# Patient Record
Sex: Female | Born: 1945 | Race: Black or African American | Hispanic: No | Smoking: Former smoker
Health system: Southern US, Community
[De-identification: ages and names within clinical notes are randomized; demographics above are authoritative.]

## PROBLEM LIST (undated history)

## (undated) DIAGNOSIS — M1990 Unspecified osteoarthritis, unspecified site: Secondary | ICD-10-CM

## (undated) DIAGNOSIS — I10 Essential (primary) hypertension: Secondary | ICD-10-CM

## (undated) HISTORY — PX: JOINT REPLACEMENT: SHX530

## (undated) HISTORY — PX: TOTAL KNEE ARTHROPLASTY: SHX125

## (undated) HISTORY — PX: TUBAL LIGATION: SHX77

---

## 2011-08-03 ENCOUNTER — Emergency Department (HOSPITAL_BASED_OUTPATIENT_CLINIC_OR_DEPARTMENT_OTHER)
Admission: EM | Admit: 2011-08-03 | Discharge: 2011-08-03 | Disposition: A | Discharge status: Home / Self Care | Payer: BC Managed Care – PPO | Attending: Emergency Medicine | Admitting: Emergency Medicine

## 2011-08-03 ENCOUNTER — Emergency Department (INDEPENDENT_AMBULATORY_CARE_PROVIDER_SITE_OTHER): Payer: BC Managed Care – PPO

## 2011-08-03 ENCOUNTER — Encounter (HOSPITAL_BASED_OUTPATIENT_CLINIC_OR_DEPARTMENT_OTHER): Payer: Self-pay | Admitting: *Deleted

## 2011-08-03 DIAGNOSIS — M949 Disorder of cartilage, unspecified: Secondary | ICD-10-CM

## 2011-08-03 DIAGNOSIS — M899 Disorder of bone, unspecified: Secondary | ICD-10-CM

## 2011-08-03 DIAGNOSIS — W010XXA Fall on same level from slipping, tripping and stumbling without subsequent striking against object, initial encounter: Secondary | ICD-10-CM | POA: Insufficient documentation

## 2011-08-03 DIAGNOSIS — Z8739 Personal history of other diseases of the musculoskeletal system and connective tissue: Secondary | ICD-10-CM | POA: Insufficient documentation

## 2011-08-03 DIAGNOSIS — M25569 Pain in unspecified knee: Secondary | ICD-10-CM

## 2011-08-03 DIAGNOSIS — M1710 Unilateral primary osteoarthritis, unspecified knee: Secondary | ICD-10-CM

## 2011-08-03 DIAGNOSIS — S8000XA Contusion of unspecified knee, initial encounter: Secondary | ICD-10-CM | POA: Insufficient documentation

## 2011-08-03 DIAGNOSIS — E119 Type 2 diabetes mellitus without complications: Secondary | ICD-10-CM | POA: Insufficient documentation

## 2011-08-03 DIAGNOSIS — Z79899 Other long term (current) drug therapy: Secondary | ICD-10-CM | POA: Insufficient documentation

## 2011-08-03 DIAGNOSIS — Y93E1 Activity, personal bathing and showering: Secondary | ICD-10-CM | POA: Insufficient documentation

## 2011-08-03 DIAGNOSIS — W19XXXA Unspecified fall, initial encounter: Secondary | ICD-10-CM

## 2011-08-03 DIAGNOSIS — I10 Essential (primary) hypertension: Secondary | ICD-10-CM | POA: Insufficient documentation

## 2011-08-03 DIAGNOSIS — Y92009 Unspecified place in unspecified non-institutional (private) residence as the place of occurrence of the external cause: Secondary | ICD-10-CM | POA: Insufficient documentation

## 2011-08-03 DIAGNOSIS — J45909 Unspecified asthma, uncomplicated: Secondary | ICD-10-CM | POA: Insufficient documentation

## 2011-08-03 DIAGNOSIS — S8012XA Contusion of left lower leg, initial encounter: Secondary | ICD-10-CM

## 2011-08-03 HISTORY — DX: Unspecified osteoarthritis, unspecified site: M19.90

## 2011-08-03 HISTORY — DX: Essential (primary) hypertension: I10

## 2011-08-03 MED ORDER — HYDROCODONE-ACETAMINOPHEN 5-325 MG PO TABS
1.0000 | ORAL_TABLET | Freq: Once | ORAL | Status: AC
Start: 2011-08-03 — End: 2011-08-03
  Administered 2011-08-03: 1 via ORAL
  Filled 2011-08-03: qty 1

## 2011-08-03 MED ORDER — HYDROCODONE-ACETAMINOPHEN 5-325 MG PO TABS: 1.0000 | ORAL_TABLET | ORAL | Status: AC | PRN

## 2011-08-03 NOTE — Discharge Instructions (Signed)
Contusion A contusion is a deep bruise. Bruises happen when an injury causes bleeding under the skin. Signs of bruising include pain, puffiness (swelling), and discolored skin. The bruise may turn blue, purple, or yellow. HOME CARE   Rest the injured area until the pain and puffiness are better.   Try to limit use of the injured area as much as possible or as told by your doctor.   Put ice on the injured area.   Put ice in a plastic bag.   Place a towel between your skin and the bag.   Leave the ice on for 15 to 20 minutes, 3 to 4 times a day.   Raise (elevate) the injured area above the level of the heart.   Use an elastic bandage to lessen puffiness and motion.   Only take medicine as told by your doctor.   Eat healthy.   See your doctor for a follow-up visit.  GET HELP RIGHT AWAY IF:   There is more redness, puffiness, or pain.   You have a headache, muscle ache, or you feel dizzy and ill.   You have a fever.   The pain is not controlled with medicine.   The bruise is not getting better.   There is yellowish white fluid (pus) coming from the wound.   You lose feeling (numbness) in the injured area.   The bruised area feels cold.   There are new problems.  MAKE SURE YOU:   Understand these instructions.   Will watch your condition.   Will get help right away if you are not doing well or get worse.  Document Released: 11/20/2007 Document Revised: 02/13/2011 Document Reviewed: 11/20/2007 [facility] Patient Information 2012 [place], [place].    Do not take Xanax while taking the pain medicine prescribed as it can be a dangerous combination. Take Tylenol for mild pain or the pain medicine prescribed for bad pain. Keep your scheduled appointment with your doctor. in 2 days

## 2011-08-03 NOTE — ED Notes (Signed)
Pt states she slipped in the shower today and injured her left knee. Hx total knee replacement on the right about 1-1/2 years ago. Also hit left side of face.

## 2011-08-03 NOTE — ED Provider Notes (Signed)
History    Scribed for Doug Sou, MD, the patient was seen in room [facility]. This chart was scribed by Katha Cabal.   CSN: [id number]  Arrival date & time 08/03/11  1329   First MD Initiated Contact with Patient 08/03/11 1605      Chief Complaint  Patient presents with  . Fall    (Consider location/radiation/quality/duration/timing/severity/associated sxs/prior treatment) HPI Patient seen at 4:07 PM  Tina [name] is a 66 y.o. female who presents to the Emergency Department complaining of fall around 1 PM today. Patient slipped in the shower today. Patient struck left face during fall. Pain and swelling around left knee.  Reports left knee soreness and stiffness.   Patient was helped up by husband after fall.   Patient has not taken anything for the pain.   Pain rated 4/10.   Discomfort of face is minimal as patient put hand up to shield her face.  There was no blood loss.  Patient with history if diabetes mellitus and hypertension.  Patient with allergy to Percocet.     Past Medical History  Diagnosis Date  . Diabetes mellitus   . Hypertension   . Asthma   . Arthritis     Past Surgical History  Procedure Date  . Total knee arthroplasty   . Tubal ligation     History reviewed. No pertinent family history.  History  Substance Use Topics  . Smoking status: Never Smoker   . Smokeless tobacco: Not on file  . Alcohol Use:     OB History    Grav Para Term Preterm Abortions TAB SAB Ect Mult Living                  Review of Systems  Constitutional: Negative.   HENT: Negative.   Respiratory: Negative.   Cardiovascular: Negative.   Gastrointestinal: Negative.   Musculoskeletal:       Left knee pain  Skin: Negative.   Neurological: Negative.   Hematological: Negative.   Psychiatric/Behavioral: Negative.     Allergies  Lactose intolerance (gi) and Percocet  Home Medications   Current Outpatient Rx  Name Route Sig Dispense Refill  . ALPRAZOLAM  0.25 MG PO TABS Oral Take 0.25 mg by mouth 3 (three) times daily as needed. For anxiety    . ASPIRIN 81 MG PO TABS Oral Take 81 mg by mouth daily.    Marland Kitchen CALCIUM-VITAMIN D-VITAMIN K 500-100-40 MG-UNT-MCG PO CHEW Oral Chew 2 tablets by mouth daily.    . OMEGA-3 FATTY ACIDS 1000 MG PO CAPS Oral Take 1 g by mouth 3 (three) times daily as needed. Every other day     . FLUTICASONE-SALMETEROL 250-50 MCG/DOSE IN AEPB Inhalation Inhale 1 puff into the lungs every 12 (twelve) hours.    Marland Kitchen LEVALBUTEROL TARTRATE 45 MCG/ACT IN AERO Inhalation Inhale 1-2 puffs into the lungs every 4 (four) hours as needed. For shortness of breath and wheezing    . OMEPRAZOLE 40 MG PO CPDR Oral Take 40 mg by mouth daily.    Marland Kitchen PIOGLITAZONE HCL 30 MG PO TABS Oral Take 30 mg by mouth daily.    Marland Kitchen SIMVASTATIN 80 MG PO TABS Oral Take 40 mg by mouth at bedtime.     . TELMISARTAN-HCTZ 80-25 MG PO TABS Oral Take 1 tablet by mouth daily.    Marland Kitchen ZOLPIDEM TARTRATE 10 MG PO TABS Oral Take 10 mg by mouth at bedtime as needed. For sleep      BP 128/65  Pulse  70  Temp(Src) 97.9 F (36.6 C) (Oral)  Resp 18  Ht 5\' 6"  (1.676 m)  Wt 218 lb (98.884 kg)  BMI 35.19 kg/m2  SpO2 100%  Physical Exam  Nursing note and vitals reviewed. Constitutional: She appears well-developed and well-nourished.  HENT:  Head: Normocephalic and atraumatic.  Eyes: Conjunctivae are normal. Pupils are equal, round, and reactive to light.  Neck: Neck supple. No tracheal deviation present. No thyromegaly present.  Cardiovascular: Normal rate and regular rhythm.   No murmur heard. Pulmonary/Chest: Effort normal and breath sounds normal.  Abdominal: Soft. Bowel sounds are normal. She exhibits no distension. There is no tenderness.  Musculoskeletal: Normal range of motion. She exhibits tenderness. She exhibits no edema.       Left lower extremity Swelling and tenderness to promixal shin, immediately distal to the knee .knee non tender, non ligament laxity, DP pulse  2+,    Neurological: She is alert. Coordination normal.  Skin: Skin is warm and dry. No rash noted.  Psychiatric: She has a normal mood and affect.    ED Course  Procedures (including critical care time)   DIAGNOSTIC STUDIES: Oxygen Saturation is 100% on room air, normal by my interpretation.     COORDINATION OF CARE: 4:15 PM  Physical exam complete.  Will get patient up to walk around.   4:20 PM  Walks with lymph favoring left leg with cane.       LABS / RADIOLOGY:   Labs Reviewed - No data to display Dg Knee Complete 4 Views Left  08/03/2011  *RADIOLOGY REPORT*  Clinical Data: Fall in shower with anterior distal knee pain.  LEFT KNEE - COMPLETE 4+ VIEW  Comparison: None.  Findings: Mild osteopenia.  Moderate to marked medial and moderate lateral compartment joint space narrowing and osteophyte formation. 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  Vascular calcifications which are age advanced.  Artifact on the first two images.  No convincing evidence of underlying fracture. No definite joint effusion.  Enthesopathic change at the quadriceps insertion.  IMPRESSION:  1.  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 2.  No definite acute finding identified. 3.  Degraded exam, secondary the extent of underlying arthropathy and clothing artifact on the first two images.  If high clinical concern of fracture, CT should be considered.  Original Report Authenticated By: Consuello Bossier, M.D.     No diagnosis found.    MDM  Plan prescription hydrocodone-A. Pap. Patient instructed to not take Xanax while taking Norco. Keep scheduled appointment with PMD in 2 days. Clinically I do not feel that knee is involved in injury today. Proximal tibia/fibula as well visualized on x-ray an x-ray was reviewed by me Diagnosis contusion left lower extremity   I personally performed the services described in this documentation, which was scribed in my presence. The  recorded information has been reviewed and considered.    Doug Sou, MD 08/03/11 [phone]

## 2012-11-21 DIAGNOSIS — R0602 Shortness of breath: Secondary | ICD-10-CM | POA: Insufficient documentation

## 2012-11-21 DIAGNOSIS — K219 Gastro-esophageal reflux disease without esophagitis: Secondary | ICD-10-CM | POA: Insufficient documentation

## 2012-11-21 DIAGNOSIS — K449 Diaphragmatic hernia without obstruction or gangrene: Secondary | ICD-10-CM | POA: Insufficient documentation

## 2012-11-21 DIAGNOSIS — E669 Obesity, unspecified: Secondary | ICD-10-CM | POA: Insufficient documentation

## 2013-03-31 DIAGNOSIS — J309 Allergic rhinitis, unspecified: Secondary | ICD-10-CM | POA: Insufficient documentation

## 2013-03-31 DIAGNOSIS — J3089 Other allergic rhinitis: Secondary | ICD-10-CM | POA: Insufficient documentation

## 2013-08-17 DIAGNOSIS — E1139 Type 2 diabetes mellitus with other diabetic ophthalmic complication: Secondary | ICD-10-CM | POA: Insufficient documentation

## 2013-08-17 DIAGNOSIS — D509 Iron deficiency anemia, unspecified: Secondary | ICD-10-CM | POA: Insufficient documentation

## 2013-08-17 DIAGNOSIS — G4733 Obstructive sleep apnea (adult) (pediatric): Secondary | ICD-10-CM | POA: Insufficient documentation

## 2013-08-17 DIAGNOSIS — J45909 Unspecified asthma, uncomplicated: Secondary | ICD-10-CM | POA: Insufficient documentation

## 2013-08-17 DIAGNOSIS — E1149 Type 2 diabetes mellitus with other diabetic neurological complication: Secondary | ICD-10-CM | POA: Insufficient documentation

## 2013-08-17 DIAGNOSIS — R600 Localized edema: Secondary | ICD-10-CM | POA: Insufficient documentation

## 2013-08-17 DIAGNOSIS — R609 Edema, unspecified: Secondary | ICD-10-CM | POA: Insufficient documentation

## 2013-08-17 DIAGNOSIS — E559 Vitamin D deficiency, unspecified: Secondary | ICD-10-CM | POA: Insufficient documentation

## 2013-08-17 DIAGNOSIS — E7800 Pure hypercholesterolemia, unspecified: Secondary | ICD-10-CM | POA: Insufficient documentation

## 2013-08-17 DIAGNOSIS — D649 Anemia, unspecified: Secondary | ICD-10-CM | POA: Insufficient documentation

## 2013-09-09 DIAGNOSIS — I10 Essential (primary) hypertension: Secondary | ICD-10-CM | POA: Insufficient documentation

## 2013-09-09 DIAGNOSIS — F459 Somatoform disorder, unspecified: Secondary | ICD-10-CM | POA: Insufficient documentation

## 2013-09-09 DIAGNOSIS — G4700 Insomnia, unspecified: Secondary | ICD-10-CM | POA: Insufficient documentation

## 2013-10-15 DIAGNOSIS — M7730 Calcaneal spur, unspecified foot: Secondary | ICD-10-CM | POA: Insufficient documentation

## 2013-10-15 DIAGNOSIS — M79609 Pain in unspecified limb: Secondary | ICD-10-CM | POA: Insufficient documentation

## 2014-03-28 DIAGNOSIS — J431 Panlobular emphysema: Secondary | ICD-10-CM | POA: Insufficient documentation

## 2014-03-28 DIAGNOSIS — E876 Hypokalemia: Secondary | ICD-10-CM | POA: Insufficient documentation

## 2014-10-03 DIAGNOSIS — R0989 Other specified symptoms and signs involving the circulatory and respiratory systems: Secondary | ICD-10-CM | POA: Insufficient documentation

## 2014-10-10 DIAGNOSIS — I517 Cardiomegaly: Secondary | ICD-10-CM | POA: Insufficient documentation

## 2014-10-10 DIAGNOSIS — I272 Pulmonary hypertension, unspecified: Secondary | ICD-10-CM | POA: Insufficient documentation

## 2014-10-10 DIAGNOSIS — I361 Nonrheumatic tricuspid (valve) insufficiency: Secondary | ICD-10-CM | POA: Insufficient documentation

## 2014-10-19 DIAGNOSIS — H269 Unspecified cataract: Secondary | ICD-10-CM | POA: Insufficient documentation

## 2014-10-19 DIAGNOSIS — H409 Unspecified glaucoma: Secondary | ICD-10-CM | POA: Insufficient documentation

## 2014-11-25 DIAGNOSIS — R0989 Other specified symptoms and signs involving the circulatory and respiratory systems: Secondary | ICD-10-CM | POA: Insufficient documentation

## 2014-12-12 DIAGNOSIS — I6523 Occlusion and stenosis of bilateral carotid arteries: Secondary | ICD-10-CM | POA: Insufficient documentation

## 2014-12-20 DIAGNOSIS — M25562 Pain in left knee: Secondary | ICD-10-CM | POA: Insufficient documentation

## 2014-12-27 DIAGNOSIS — R42 Dizziness and giddiness: Secondary | ICD-10-CM | POA: Insufficient documentation

## 2015-04-20 DIAGNOSIS — M2041 Other hammer toe(s) (acquired), right foot: Secondary | ICD-10-CM | POA: Insufficient documentation

## 2015-07-17 DIAGNOSIS — I739 Peripheral vascular disease, unspecified: Secondary | ICD-10-CM | POA: Insufficient documentation

## 2015-10-18 DIAGNOSIS — Z96653 Presence of artificial knee joint, bilateral: Secondary | ICD-10-CM | POA: Insufficient documentation

## 2015-10-18 DIAGNOSIS — M7052 Other bursitis of knee, left knee: Secondary | ICD-10-CM | POA: Insufficient documentation

## 2016-04-15 DIAGNOSIS — B351 Tinea unguium: Secondary | ICD-10-CM | POA: Insufficient documentation

## 2016-07-29 DIAGNOSIS — M8580 Other specified disorders of bone density and structure, unspecified site: Secondary | ICD-10-CM | POA: Insufficient documentation

## 2016-08-28 DIAGNOSIS — F458 Other somatoform disorders: Secondary | ICD-10-CM | POA: Insufficient documentation

## 2016-08-28 DIAGNOSIS — F411 Generalized anxiety disorder: Secondary | ICD-10-CM | POA: Insufficient documentation

## 2016-09-21 ENCOUNTER — Encounter (HOSPITAL_BASED_OUTPATIENT_CLINIC_OR_DEPARTMENT_OTHER): Payer: Self-pay | Admitting: *Deleted

## 2016-09-21 ENCOUNTER — Emergency Department (HOSPITAL_BASED_OUTPATIENT_CLINIC_OR_DEPARTMENT_OTHER)
Admission: EM | Admit: 2016-09-21 | Discharge: 2016-09-21 | Disposition: A | Discharge status: Home / Self Care | Payer: Medicare Other | Attending: Emergency Medicine | Admitting: Emergency Medicine

## 2016-09-21 DIAGNOSIS — Z79899 Other long term (current) drug therapy: Secondary | ICD-10-CM | POA: Insufficient documentation

## 2016-09-21 DIAGNOSIS — Z7982 Long term (current) use of aspirin: Secondary | ICD-10-CM | POA: Diagnosis not present

## 2016-09-21 DIAGNOSIS — J45909 Unspecified asthma, uncomplicated: Secondary | ICD-10-CM | POA: Diagnosis not present

## 2016-09-21 DIAGNOSIS — Z87891 Personal history of nicotine dependence: Secondary | ICD-10-CM | POA: Diagnosis not present

## 2016-09-21 DIAGNOSIS — M62838 Other muscle spasm: Secondary | ICD-10-CM

## 2016-09-21 DIAGNOSIS — M25511 Pain in right shoulder: Secondary | ICD-10-CM | POA: Diagnosis present

## 2016-09-21 DIAGNOSIS — E119 Type 2 diabetes mellitus without complications: Secondary | ICD-10-CM | POA: Diagnosis not present

## 2016-09-21 DIAGNOSIS — I10 Essential (primary) hypertension: Secondary | ICD-10-CM | POA: Insufficient documentation

## 2016-09-21 DIAGNOSIS — M542 Cervicalgia: Secondary | ICD-10-CM

## 2016-09-21 MED ORDER — CYCLOBENZAPRINE HCL 5 MG PO TABS: 5.0000 mg | ORAL_TABLET | Freq: Two times a day (BID) | ORAL | 0 refills | Status: DC | PRN

## 2016-09-21 NOTE — ED Triage Notes (Signed)
Patient states for the last week she has had some intermittent pain in the right shoulder and neck.  States last night she developed a sharp pain in the right upper shoulder with radiation into the right neck.  Pain is now constant.

## 2016-09-21 NOTE — ED Provider Notes (Signed)
[facility] Provider Note   CSN: [id number] Arrival date & time: 09/21/16  4098     History   Chief Complaint Chief Complaint  Patient presents with  . Shoulder Pain    right    HPI Tina [name] is a 71 y.o. female with history of diabetes, hypertension who presents with a one-week history of right upper shoulder and right-sided neck pain. Patient states she woke up with the pain around one week ago. She has had intermittent worsening sharp pain with certain movements. Patient has pain with moving her neck. She has taken Tylenol without significant relief. She denies any numbness or tingling in her arms or difficulty moving her arms. 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 She denies any known trauma or specific injury.  HPI  Past Medical History:  Diagnosis Date  . Arthritis   . Asthma   . Diabetes mellitus   . Hypertension     There are no active problems to display for this patient.   Past Surgical History:  Procedure Laterality Date  . JOINT REPLACEMENT Bilateral    Knee replacements  . TOTAL KNEE ARTHROPLASTY    . TUBAL LIGATION      OB History    No data available       Home Medications    Prior to Admission medications   Medication Sig Start Date End Date Taking? Authorizing Provider  aspirin 81 MG tablet Take 325 mg by mouth daily.    Yes Historical Provider, MD  clopidogrel (PLAVIX) 75 MG tablet Take 75 mg by mouth daily.   Yes Historical Provider, MD  Fluticasone-Salmeterol (ADVAIR) 250-50 MCG/DOSE AEPB Inhale 1 puff into the lungs every 12 (twelve) hours.   Yes Historical Provider, MD  gabapentin (NEURONTIN) 300 MG capsule Take 300 mg by mouth 3 (three) times daily.   Yes Historical Provider, MD  pioglitazone (ACTOS) 30 MG tablet Take 30 mg by mouth daily.   Yes Historical Provider, MD  potassium  chloride (K-DUR,KLOR-CON) 10 MEQ tablet Take 10 mEq by mouth daily.   Yes Historical Provider, MD  ranitidine (ZANTAC) 150 MG capsule Take 150 mg by mouth 2 (two) times daily.   Yes Historical Provider, MD  sertraline (ZOLOFT) 25 MG tablet Take 25 mg by mouth daily.   Yes Historical Provider, MD  simvastatin (ZOCOR) 80 MG tablet Take 40 mg by mouth at bedtime.    Yes Historical Provider, MD  zolpidem (AMBIEN) 10 MG tablet Take 10 mg by mouth at bedtime as needed. For sleep   Yes Historical Provider, MD  ALPRAZolam (XANAX) 0.25 MG tablet Take 0.25 mg by mouth 3 (three) times daily as needed. For anxiety    Historical Provider, MD  Calcium-Vitamin D-Vitamin K (VIACTIV) 500-100-40 MG-UNT-MCG CHEW Chew 2 tablets by mouth daily.    Historical Provider, MD  cyclobenzaprine (FLEXERIL) 5 MG tablet Take 1 tablet (5 mg total) by mouth 2 (two) times daily as needed for muscle spasms. 09/21/16   Emi Holes, PA-C  fish oil-omega-3 fatty acids 1000 MG capsule Take 1 g by mouth 3 (three) times daily as needed. Every other day     Historical Provider, MD  levalbuterol [facility] HFA) 45 MCG/ACT inhaler Inhale 1-2 puffs into the lungs every 4 (four) hours as needed. For shortness of breath and wheezing    Historical Provider, MD  omeprazole (PRILOSEC) 40 MG  capsule Take 40 mg by mouth daily.    Historical Provider, MD  telmisartan-hydrochlorothiazide (MICARDIS HCT) 80-25 MG per tablet Take 1 tablet by mouth daily.    Historical Provider, MD    Family History No family history on file.  Social History Social History  Substance Use Topics  . Smoking status: Former Games developer  . Smokeless tobacco: Never Used  . Alcohol use No     Comment: rarely     Allergies   Aleve [naproxen]; Lactose intolerance (gi); Other; Percocet [oxycodone-acetaminophen]; and Vicodin [hydrocodone-acetaminophen]   Review of Systems Review of Systems  Constitutional: Negative for chills and fever.  HENT: Negative for facial  swelling and sore throat.   Respiratory: Negative for shortness of breath.   Cardiovascular: Negative for chest pain.  Gastrointestinal: Negative for abdominal pain, nausea and vomiting.  Genitourinary: Negative for dysuria.  Musculoskeletal: Positive for arthralgias and neck pain (R upper trapezius). Negative for back pain.  Skin: Negative for rash and wound.  Neurological: Negative for numbness and headaches.  Psychiatric/Behavioral: The patient is not nervous/anxious.      Physical Exam Updated Vital Signs BP (!) 147/59 (BP Location: Left Arm)   Pulse 64   Temp 98.4 F (36.9 C) (Oral)   Resp 20   Ht  (1.676 m)   Wt 101.2 kg   SpO2 98%   BMI 35.99 kg/m   Physical Exam  Constitutional: She appears well-developed and well-nourished. No distress.  HENT:  Head: Normocephalic and atraumatic.  Mouth/Throat: Oropharynx is clear and moist. No oropharyngeal exudate.  Eyes: Conjunctivae are normal. Pupils are equal, round, and reactive to light. Right eye exhibits no discharge. Left eye exhibits no discharge. No scleral icterus.  Neck: Neck supple. Muscular tenderness present. No spinous process tenderness present. Decreased range of motion present. No thyromegaly present.    Cardiovascular: Normal rate, regular rhythm, normal heart sounds and intact distal pulses.  Exam reveals no gallop and no friction rub.   No murmur heard. Pulmonary/Chest: Effort normal and breath sounds normal. No stridor. No respiratory distress. She has no wheezes. She has no rales.  Abdominal: Soft. Bowel sounds are normal. She exhibits no distension. There is no tenderness. There is no rebound and no guarding.  Musculoskeletal: She exhibits no edema.       Right shoulder: She exhibits tenderness, bony tenderness and spasm.  Lymphadenopathy:    She has no cervical adenopathy.  Neurological: She is alert. Coordination normal.  Normal sensation and 5/5 strength to upper extremities, bilateral equal grip  strength  Skin: Skin is warm and dry. No rash noted. She is not diaphoretic. No pallor.  Psychiatric: She has a normal mood and affect.  Nursing note and vitals reviewed.    ED Treatments / Results  Labs (all labs ordered are listed, but only abnormal results are displayed) Labs Reviewed - No data to display  EKG  EKG Interpretation None       Radiology No results found.  Procedures Procedures (including critical care time)  Medications Ordered in ED Medications - No data to display   Initial Impression / Assessment and Plan / ED Course  I have reviewed the triage vital signs and the nursing notes.  Pertinent labs & imaging results that were available during my care of the patient were reviewed by me and considered in my medical decision making (see chart for details).     Patient with probable muscle strain and spasm to right upper trapezius. Patient neurovascularly intact with  full range of motion and strength to bilateral upper extremities. Supportive treatment discussed including heat, stretches, massage. NSAID treatments and low-dose Flexeril advised. Follow-up to PCP in 2-3 days if symptoms are not improving. Return precautions discussed. Patient understands and agrees with plan. Patient vitals stable throughout ED course and discharged in satisfactory condition.  Final Clinical Impressions(s) / ED Diagnoses   Final diagnoses:  Cervical paraspinal muscle spasm  Neck pain on right side    New Prescriptions Discharge Medication List as of 09/21/2016 10:40 AM    START taking these medications   Details  cyclobenzaprine (FLEXERIL) 5 MG tablet Take 1 tablet (5 mg total) by mouth 2 (two) times daily as needed for muscle spasms., Starting Sat 09/21/2016, Print         Emi Holes, PA-C 09/21/16 1326    Jerelyn Scott, MD 09/21/16 1329

## 2016-09-21 NOTE — Discharge Instructions (Signed)
Medications: Flexeril  Treatment: Take Flexeril twice daily as needed for muscle spasms. Do not drive or operate machinery when taking this medication. Use a heating pad 3-4 times daily alternating 20 minutes on, 20 minutes off. After heat, attempts the stretches that we discussed, holding for 30 seconds, 3-4 times daily. You may also find a massage helpful.  Follow-up: Please follow-up with your primary care provider in 2-3 days if your symptoms are not improving. Please return to the emergency department if you develop any new or worsening symptoms including inability to move your arms, complete numbness, or any other new or concerning symptoms.

## 2018-07-17 DIAGNOSIS — Z0000 Encounter for general adult medical examination without abnormal findings: Secondary | ICD-10-CM | POA: Insufficient documentation

## 2018-12-09 DIAGNOSIS — W19XXXA Unspecified fall, initial encounter: Secondary | ICD-10-CM | POA: Insufficient documentation

## 2018-12-30 ENCOUNTER — Encounter: Payer: Self-pay | Admitting: Allergy and Immunology

## 2018-12-30 ENCOUNTER — Other Ambulatory Visit: Payer: Self-pay

## 2018-12-30 ENCOUNTER — Ambulatory Visit (INDEPENDENT_AMBULATORY_CARE_PROVIDER_SITE_OTHER): Payer: Medicare HMO | Admitting: Allergy and Immunology

## 2018-12-30 VITALS — BP 140/80 | HR 71 | Temp 98.6°F | Resp 20 | Ht 64.57 in | Wt 230.0 lb

## 2018-12-30 DIAGNOSIS — J4540 Moderate persistent asthma, uncomplicated: Secondary | ICD-10-CM

## 2018-12-30 DIAGNOSIS — J3089 Other allergic rhinitis: Secondary | ICD-10-CM | POA: Diagnosis not present

## 2018-12-30 MED ORDER — MONTELUKAST SODIUM 10 MG PO TABS: 10.0000 mg | ORAL_TABLET | Freq: Every day | ORAL | 5 refills | Status: DC

## 2018-12-30 MED ORDER — AZELASTINE HCL 0.15 % NA SOLN: 1.0000 | Freq: Two times a day (BID) | NASAL | 5 refills | Status: DC

## 2018-12-30 MED ORDER — BUDESONIDE-FORMOTEROL FUMARATE 160-4.5 MCG/ACT IN AERO: 2.0000 | INHALATION_SPRAY | Freq: Two times a day (BID) | RESPIRATORY_TRACT | 5 refills | Status: DC

## 2018-12-30 NOTE — Patient Instructions (Addendum)
Moderate persistent asthma Currently with suboptimal control, we will step up therapy at this time.  In addition, todays spirometry results, assessed while asymptomatic, suggest under-perception of bronchoconstriction.  A prescription has been provided for Symbicort (budesonide/formoterol) 160/4.5 g, 2 inhalations twice a day. To maximize pulmonary deposition, a spacer has been provided along with instructions for its proper administration with an HFA inhaler.  A prescription has been provided for montelukast 10 mg daily at bedtime.  Continue albuterol every 4-6 hours if needed and 10 minutes prior to exercise.  Subjective and objective measures of pulmonary function will be followed and the treatment plan will be adjusted accordingly.  Perennial allergic rhinitis  Aeroallergen avoidance measures have been discussed and provided in written form.  A prescription has been provided for azelastine nasal spray, 1-2 sprays per nostril 2 times daily as needed. Proper nasal spray technique has been discussed and demonstrated.   If needed, may add fluticasone nasal spray.  Nasal saline spray (i.e., Simply Saline) or nasal saline lavage (i.e., NeilMed) is recommended as needed and prior to medicated nasal sprays.   Return in about 2 months (around 03/02/2019), or if symptoms worsen or fail to improve.  Control of House Dust Mite Allergen  House dust mites play a major role in allergic asthma and rhinitis.  They occur in environments with high humidity wherever human skin, the food for dust mites is found. High levels have been detected in dust obtained from mattresses, pillows, carpets, upholstered furniture, bed covers, clothes and soft toys.  The principal allergen of the house dust mite is found in its feces.  A gram of dust may contain 1,000 mites and 250,000 fecal particles.  Mite antigen is easily measured in the air during house cleaning activities.    1. Encase mattresses, including the  box spring, and pillow, in an air tight cover.  Seal the zipper end of the encased mattresses with wide adhesive tape. 2. Wash the bedding in water of 130 degrees Farenheit weekly.  Avoid cotton comforters/quilts and flannel bedding: the most ideal bed covering is the dacron comforter. 3. Remove all upholstered furniture from the bedroom. 4. Remove carpets, carpet padding, rugs, and non-washable window drapes from the bedroom.  Wash drapes weekly or use plastic window coverings. 5. Remove all non-washable stuffed toys from the bedroom.  Wash stuffed toys weekly. 6. Have the room cleaned frequently with a vacuum cleaner and a damp dust-mop.  The patient should not be in a room which is being cleaned and should wait 1 hour after cleaning before going into the room. 7. Close and seal all heating outlets in the bedroom.  Otherwise, the room will become filled with dust-laden air.  An electric heater can be used to heat the room. 8. Reduce indoor humidity to less than 50%.  Do not use a humidifier.

## 2018-12-30 NOTE — Progress Notes (Signed)
New Patient Note  RE: Tina [name] [name] MRN: [id number] DOB: 02/11/1946 Date of Office Visit: 12/30/2018  Referring provider: Patria ManeGassemi, Mike, MD Primary care provider: Patria ManeGassemi, Mike, MD  Chief Complaint: Asthma and Nasal Congestion  History of present illness: Tina [name] is a 73 y.o. female seen today in consultation requested by Patria ManeMike Gassemi, MD.  She reports that she was diagnosed with asthma approximately 10 years ago.  Initially, she used albuterol via the nebulizer as needed but was not on a controller medication.  She believes that she was started on Advair 250/50 g approximately 2 years ago but was switched to Flovent 110 g approximately 1 year ago.  She had been taking 1 inhalation of Flovent twice daily until recently when she was instructed to take 2 inhalations twice daily.  She currently does not use a spacer device with her HFA inhalers.  She reports that she requires albuterol rescue every other day on average.  She does not experience nocturnal awakenings due to lower respiratory symptoms.  Her asthma symptom triggers include mild/moderate exertion, excessive talking, and extremes of temperature.  She experiences nasal congestion, rhinorrhea, sneezing, and postnasal drainage.  She experiences ocular pruritus, however believes that this may be due to dry eyes which she treats with Systane eyedrops.  No significant seasonal symptom variation has been noted nor have specific environmental triggers been identified.  She uses fluticasone nasal spray and/or nasal saline spray in an attempt to control her nasal symptoms.  Assessment and plan: Moderate persistent asthma Currently with suboptimal control, we will step up therapy at this time.  In addition, todays spirometry results, assessed while asymptomatic, suggest under-perception of bronchoconstriction.  A prescription has been provided for Symbicort (budesonide/formoterol) 160/4.5 g, 2 inhalations twice a day. To  maximize pulmonary deposition, a spacer has been provided along with instructions for its proper administration with an HFA inhaler.  A prescription has been provided for montelukast 10 mg daily at bedtime.  Continue albuterol every 4-6 hours if needed and 10 minutes prior to exercise.  Subjective and objective measures of pulmonary function will be followed and the treatment plan will be adjusted accordingly.  Perennial allergic rhinitis  Aeroallergen avoidance measures have been discussed and provided in written form.  A prescription has been provided for azelastine nasal spray, 1-2 sprays per nostril 2 times daily as needed. Proper nasal spray technique has been discussed and demonstrated.   If needed, may add fluticasone nasal spray.  Nasal saline spray (i.e., Simply Saline) or nasal saline lavage (i.e., NeilMed) is recommended as needed and prior to medicated nasal sprays.   Meds ordered this encounter  Medications  . budesonide-formoterol (SYMBICORT) 160-4.5 MCG/ACT inhaler    Sig: Inhale 2 puffs into the lungs 2 (two) times daily.    Dispense:  1 Inhaler    Refill:  5  . montelukast (SINGULAIR) 10 MG tablet    Sig: Take 1 tablet (10 mg total) by mouth at bedtime.    Dispense:  30 tablet    Refill:  5  . Azelastine HCl 0.15 % SOLN    Sig: Place 1-2 sprays into both nostrils 2 (two) times daily.    Dispense:  30 mL    Refill:  5    Diagnostics: Spirometry: FVC was 1.44 L (61% predicted) and FEV1 was 0.74 L (40% predicted) with an FEV1 ratio of 66%.  There was significant (22%) postbronchodilator improvement.  This study was performed while the patient was asymptomatic.  Please see scanned  spirometry results for details. Allergy skin testing: Positive to dust mite antigen.    Physical examination: Blood pressure 140/80, pulse 71, temperature 98.6 F (37 C), temperature source Oral, resp. rate 20, height 5' 4.57" (1.64 m), weight 230 lb (104.3 kg), SpO2 97 %.  General:  Alert, interactive, in no acute distress. HEENT: TMs pearly gray, turbinates moderately edematous with clear discharge, post-pharynx moderately erythematous. Neck: Supple without lymphadenopathy. Lungs: Mildly decreased breath sounds bilaterally without wheezing, rhonchi or rales. CV: Normal S1, S2 without murmurs. Abdomen: Nondistended, nontender. Skin: Warm and dry, without lesions or rashes. Extremities:  No clubbing, cyanosis or edema. Neuro:   Grossly intact.  Review of systems:  Review of systems negative except as noted in HPI / PMHx or noted below: Review of Systems  Constitutional: Negative.   HENT: Negative.   Eyes: Negative.   Respiratory: Negative.   Cardiovascular: Negative.   Gastrointestinal: Negative.   Genitourinary: Negative.   Musculoskeletal: Negative.   Skin: Negative.   Neurological: Negative.   Endo/Heme/Allergies: Negative.   Psychiatric/Behavioral: Negative.     Past medical history:  Past Medical History:  Diagnosis Date  . Arthritis   . Asthma   . Diabetes mellitus   . Hypertension     Past surgical history:  Past Surgical History:  Procedure Laterality Date  . JOINT REPLACEMENT Bilateral    Knee replacements  . TOTAL KNEE ARTHROPLASTY    . TUBAL LIGATION      Family history: No significant or contributory family history has been reported.  Social history: Social History   Socioeconomic History  . Marital status: Widowed    Spouse name: Not on file  . Number of children: Not on file  . Years of education: Not on file  . Highest education level: Not on file  Occupational History  . Not on file  Social Needs  . Financial resource strain: Not on file  . Food insecurity    Worry: Not on file    Inability: Not on file  . Transportation needs    Medical: Not on file    Non-medical: Not on file  Tobacco Use  . Smoking status: Former Smoker    Quit date: 07/19/1992    Years since quitting: 26.4  . Smokeless tobacco: Never Used   Substance and Sexual Activity  . Alcohol use: No    Comment: rarely  . Drug use: No  . Sexual activity: Yes    Birth control/protection: Surgical  Lifestyle  . Physical activity    Days per week: Not on file    Minutes per session: Not on file  . Stress: Not on file  Relationships  . Social Herbalist on phone: Not on file    Gets together: Not on file    Attends religious service: Not on file    Active member of club or organization: Not on file    Attends meetings of clubs or organizations: Not on file    Relationship status: Not on file  . Intimate partner violence    Fear of current or ex partner: Not on file    Emotionally abused: Not on file    Physically abused: Not on file    Forced sexual activity: Not on file  Other Topics Concern  . Not on file  Social History Narrative  . Not on file   Environmental History: The patient lives in a 72-year-old apartment with carpeting throughout and central air/heat.  There is no known  mold/water damage in the home.  She is a non-smoker without pets. Former cigarette smoker quit 25 yrs ago, smoked for 25 yrs 1 ppd.   Allergies as of 12/30/2018      Reactions   Aleve [naproxen]    Effects blood pressure   Lactose Intolerance (gi) Diarrhea   Other    All Narcotics cause itching and rash.   Percocet [oxycodone-acetaminophen] Itching   Vicodin [hydrocodone-acetaminophen] Itching      Medication List       Accurate as of December 30, 2018  1:29 PM. If you have any questions, ask your nurse or doctor.        STOP taking these medications   ALPRAZolam 0.25 MG tablet Commonly known as: Prudy FeelerXANAX Stopped by: Wellington Hampshire Carter Keah Lamba, MD   cyclobenzaprine 5 MG tablet Commonly known as: FLEXERIL Stopped by: Wellington Hampshire Carter Tewana Bohlen, MD   fish oil-omega-3 fatty acids 1000 MG capsule Stopped by: Wellington Hampshire Carter Bonetta Mostek, MD   levalbuterol 45 MCG/ACT inhaler Commonly known as: XOPENEX HFA Stopped by: Wellington Hampshire Carter Aubrie Lucien, MD   omeprazole 40 MG  capsule Commonly known as: PRILOSEC Stopped by: Wellington Hampshire Carter Adean Milosevic, MD   pioglitazone 30 MG tablet Commonly known as: ACTOS Stopped by: Wellington Hampshire Carter Khali Albanese, MD   ranitidine 150 MG capsule Commonly known as: ZANTAC Stopped by: Wellington Hampshire Carter France Noyce, MD   simvastatin 80 MG tablet Commonly known as: ZOCOR Stopped by: Wellington Hampshire Carter Shantera Monts, MD   telmisartan-hydrochlorothiazide 80-25 MG tablet Commonly known as: MICARDIS HCT Stopped by: Wellington Hampshire Carter Adana Marik, MD   zolpidem 10 MG tablet Commonly known as: AMBIEN Stopped by: Wellington Hampshire Carter Chynna Buerkle, MD     TAKE these medications   albuterol (2.5 MG/3ML) 0.083% nebulizer solution Commonly known as: PROVENTIL Inhale into the lungs.   albuterol 108 (90 Base) MCG/ACT inhaler Commonly known as: VENTOLIN HFA TAKE 2 PUFFS BY MOUTH EVERY 4 HOURS AS NEEDED FOR SHORTNESS OF BREATH   aspirin 81 MG tablet Take 325 mg by mouth daily.   atorvastatin 40 MG tablet Commonly known as: LIPITOR Take 40 mg by mouth daily.   Azelastine HCl 0.15 % Soln Place 1-2 sprays into both nostrils 2 (two) times daily. Started by: Wellington Hampshire Carter Ayman Brull, MD   BIOTIN PO Take by mouth.   budesonide-formoterol 160-4.5 MCG/ACT inhaler Commonly known as: Symbicort Inhale 2 puffs into the lungs 2 (two) times daily. Started by: Wellington Hampshire Carter Kimiah Hibner, MD   clopidogrel 75 MG tablet Commonly known as: PLAVIX Take 75 mg by mouth daily.   fluticasone 110 MCG/ACT inhaler Commonly known as: FLOVENT HFA Inhale into the lungs 2 (two) times daily.   FLUTICASONE PROPIONATE EX Apply topically.   Fluticasone-Salmeterol 250-50 MCG/DOSE Aepb Commonly known as: ADVAIR Inhale 1 puff into the lungs every 12 (twelve) hours.   gabapentin 300 MG capsule Commonly known as: NEURONTIN Take 300 mg by mouth 3 (three) times daily.   IRON CR PO Take by mouth.   montelukast 10 MG tablet Commonly known as: SINGULAIR Take 1 tablet (10 mg total) by mouth at bedtime. Started by: Wellington Hampshire Carter Dmani Mizer, MD    potassium chloride 10 MEQ tablet Commonly known as: K-DUR Take 10 mEq by mouth daily.   sertraline 25 MG tablet Commonly known as: ZOLOFT Take 25 mg by mouth daily.   Viactiv 500-100-40 MG-UNT-MCG Chew Generic drug: Calcium-Vitamin D-Vitamin K Chew 2 tablets by mouth daily.   vitamin C 500 MG tablet Commonly known as: ASCORBIC ACID Take 500 mg by mouth daily.  Known medication allergies: Allergies  Allergen Reactions  . Aleve [Naproxen]     Effects blood pressure  . Lactose Intolerance (Gi) Diarrhea  . Other     All Narcotics cause itching and rash.  Marland Kitchen. Percocet [Oxycodone-Acetaminophen] Itching  . Vicodin [Hydrocodone-Acetaminophen] Itching    I appreciate the opportunity to take part in Marleta's care. Please do not hesitate to contact me with questions.  Sincerely,   R. Jorene Guestarter Sharolyn Weber, MD

## 2018-12-30 NOTE — Assessment & Plan Note (Addendum)
Currently with suboptimal control, we will step up therapy at this time.  In addition, todays spirometry results, assessed while asymptomatic, suggest under-perception of bronchoconstriction.  A prescription has been provided for Symbicort (budesonide/formoterol) 160/4.5 g, 2 inhalations twice a day. To maximize pulmonary deposition, a spacer has been provided along with instructions for its proper administration with an HFA inhaler.  A prescription has been provided for montelukast 10 mg daily at bedtime.  Continue albuterol every 4-6 hours if needed and 10 minutes prior to exercise.  Subjective and objective measures of pulmonary function will be followed and the treatment plan will be adjusted accordingly.

## 2018-12-30 NOTE — Assessment & Plan Note (Signed)
   Aeroallergen avoidance measures have been discussed and provided in written form.  A prescription has been provided for azelastine nasal spray, 1-2 sprays per nostril 2 times daily as needed. Proper nasal spray technique has been discussed and demonstrated.   If needed, may add fluticasone nasal spray.  Nasal saline spray (i.e., Simply Saline) or nasal saline lavage (i.e., NeilMed) is recommended as needed and prior to medicated nasal sprays.

## 2019-02-25 ENCOUNTER — Other Ambulatory Visit: Payer: Self-pay

## 2019-02-25 ENCOUNTER — Telehealth: Payer: Self-pay

## 2019-02-25 MED ORDER — ADVAIR HFA 230-21 MCG/ACT IN AERO: 2.0000 | INHALATION_SPRAY | Freq: Two times a day (BID) | RESPIRATORY_TRACT | 5 refills | Status: DC

## 2019-02-25 NOTE — Telephone Encounter (Signed)
Insurance has denied the initial pa and the higher up pa on Symbicort stating it is a cost sharing tier , and there is no alternative to brand name. Please advise.  As formulary does not state what is covered

## 2019-02-25 NOTE — Telephone Encounter (Signed)
Per formulary advair hfa is approved for 1 inhaler every 30 days

## 2019-02-25 NOTE — Telephone Encounter (Signed)
Advair HFA 230-21 g, 2 inhalations via spacer device twice daily. Thank you.

## 2019-02-25 NOTE — Telephone Encounter (Signed)
Please call [employer] medicare to find out what our options are other than Symbicort. Please provide the patient with sample of Symbicort 160 while we get this sorted out. Thanks.

## 2019-02-25 NOTE — Telephone Encounter (Signed)
Sent in rx.

## 2019-03-25 ENCOUNTER — Other Ambulatory Visit: Payer: Self-pay

## 2019-03-25 ENCOUNTER — Ambulatory Visit (INDEPENDENT_AMBULATORY_CARE_PROVIDER_SITE_OTHER): Payer: Medicare HMO | Admitting: Allergy and Immunology

## 2019-03-25 ENCOUNTER — Encounter: Payer: Self-pay | Admitting: Allergy and Immunology

## 2019-03-25 VITALS — BP 112/68 | HR 70 | Temp 96.6°F | Resp 16

## 2019-03-25 DIAGNOSIS — J3089 Other allergic rhinitis: Secondary | ICD-10-CM

## 2019-03-25 DIAGNOSIS — J4540 Moderate persistent asthma, uncomplicated: Secondary | ICD-10-CM | POA: Diagnosis not present

## 2019-03-25 MED ORDER — SPIRIVA RESPIMAT 1.25 MCG/ACT IN AERS: 2.0000 | INHALATION_SPRAY | Freq: Every day | RESPIRATORY_TRACT | 3 refills | Status: DC

## 2019-03-25 NOTE — Patient Instructions (Addendum)
Moderate persistent asthma Todays spirometry results, assessed while asymptomatic, suggest under-perception of bronchoconstriction.  A prescription has been provided for Spiriva Respimat 1.25 g, 2 inhalations daily.  Continue Symbicort (budesonide/formoterol) 160/4.5 g, 2 inhalations via spacer device twice a day, montelukast 10 mg daily at bedtime, albuterol every 4-6 hours if needed and 10 minutes prior to exercise.  Subjective and objective measures of pulmonary function will be followed and the treatment plan will be adjusted accordingly.  Perennial allergic rhinitis Stable.  Continue appropriate aeroallergen avoidance measures, azelastine nasal spray, 1-2 sprays per nostril 2 times daily as needed, plus/minus fluticasone nasal spray if needed.  Nasal saline spray (i.e., Simply Saline) or nasal saline lavage (i.e., NeilMed) is recommended as needed and prior to medicated nasal sprays.   Return in about 3 months (around 06/25/2019), or if symptoms worsen or fail to improve.

## 2019-03-25 NOTE — Progress Notes (Signed)
Follow-up Note  RE: Tina BouchardJohnnie [name] MRN: [id number] DOB: 12/26/1945 Date of Office Visit: 03/25/2019  Primary care provider: Patria ManeGassemi, Mike, MD Referring provider: Patria ManeGassemi, Mike, MD  History of present illness: Tina [name] is a 73 y.o. female with persistent asthma and allergic rhinitis presenting today for follow-up.  She was previously seen in this clinic for her initial evaluation on December 30, 2018.  She reports significant improvement regarding asthma symptoms since switching from Flovent to Symbicort.  Despite significant improvement while on the Symbicort 160 g and montelukast 10 mg daily, she reports that she still experiences asthma symptoms with mild/moderate exertion, such as climbing 1 flight of stairs.  She is not experiencing nocturnal awakenings due to lower respiratory symptoms.  She reports that her nasal allergy symptoms are "very well" controlled.  Assessment and plan: Moderate persistent asthma Todays spirometry results, assessed while asymptomatic, suggest under-perception of bronchoconstriction.  A prescription has been provided for Spiriva Respimat 1.25 g, 2 inhalations daily.  Continue Symbicort (budesonide/formoterol) 160/4.5 g, 2 inhalations via spacer device twice a day, montelukast 10 mg daily at bedtime, albuterol every 4-6 hours if needed and 10 minutes prior to exercise.  Subjective and objective measures of pulmonary function will be followed and the treatment plan will be adjusted accordingly.  Perennial allergic rhinitis Stable.  Continue appropriate aeroallergen avoidance measures, azelastine nasal spray, 1-2 sprays per nostril 2 times daily as needed, plus/minus fluticasone nasal spray if needed.  Nasal saline spray (i.e., Simply Saline) or nasal saline lavage (i.e., NeilMed) is recommended as needed and prior to medicated nasal sprays.   Meds ordered this encounter  Medications  . Tiotropium Bromide Monohydrate (SPIRIVA RESPIMAT) 1.25  MCG/ACT AERS    Sig: Inhale 2 puffs into the lungs daily.    Dispense:  12 g    Refill:  3    Diagnostics: Spirometry reveals an FVC of 1.57 L (64% predicted) and FEV1 is 0.87 L (46% predicted) with 140 mL (16%) postbronchodilator improvement.  This study was performed while the patient was asymptomatic.  Please see scanned spirometry results for details.    Physical examination: Blood pressure 112/68, pulse 70, temperature (!) 96.6 F (35.9 C), temperature source Temporal, resp. rate 16, SpO2 96 %.  General: Alert, interactive, in no acute distress. HEENT: TMs pearly gray, turbinates minimally edematous without discharge, post-pharynx unremarkable. Neck: Supple without lymphadenopathy. Lungs: Mildly decreased breath sounds bilaterally without wheezing, rhonchi or rales. CV: Normal S1, S2 without murmurs. Skin: Warm and dry, without lesions or rashes.  The following portions of the patient's history were reviewed and updated as appropriate: allergies, current medications, past family history, past medical history, past social history, past surgical history and problem list.  Current Outpatient Medications  Medication Sig Dispense Refill  . albuterol (PROVENTIL) (2.5 MG/3ML) 0.083% nebulizer solution Inhale into the lungs.    Marland Kitchen. albuterol (VENTOLIN HFA) 108 (90 Base) MCG/ACT inhaler Inhale 2 puffs into the lungs every 6 (six) hours as needed for wheezing or shortness of breath.    Marland Kitchen. aspirin 81 MG tablet Take 325 mg by mouth daily.     Marland Kitchen. atorvastatin (LIPITOR) 40 MG tablet Take 40 mg by mouth daily.    . Azelastine HCl 0.15 % SOLN Place 1-2 sprays into both nostrils 2 (two) times daily. 30 mL 5  . BIOTIN PO Take by mouth.    . budesonide-formoterol (SYMBICORT) 160-4.5 MCG/ACT inhaler Inhale 2 puffs into the lungs 2 (two) times daily. 1 Inhaler 5  . Calcium-Vitamin  D-Vitamin K (VIACTIV) 500-100-40 MG-UNT-MCG CHEW Chew 2 tablets by mouth daily.    . clopidogrel (PLAVIX) 75 MG tablet Take  75 mg by mouth daily.    Marland Kitchen FLUTICASONE PROPIONATE EX Apply topically.    . gabapentin (NEURONTIN) 300 MG capsule Take 300 mg by mouth 3 (three) times daily.    . IRON CR PO Take by mouth.    . montelukast (SINGULAIR) 10 MG tablet Take 1 tablet (10 mg total) by mouth at bedtime. 30 tablet 5  . potassium chloride (K-DUR,KLOR-CON) 10 MEQ tablet Take 10 mEq by mouth daily.    . sertraline (ZOLOFT) 25 MG tablet Take 25 mg by mouth daily.    . vitamin C (ASCORBIC ACID) 500 MG tablet Take 500 mg by mouth daily.    . Tiotropium Bromide Monohydrate (SPIRIVA RESPIMAT) 1.25 MCG/ACT AERS Inhale 2 puffs into the lungs daily. 12 g 3   No current facility-administered medications for this visit.     Allergies  Allergen Reactions  . Aleve [Naproxen]     Effects blood pressure  . Lactose Intolerance (Gi) Diarrhea  . Other     All Narcotics cause itching and rash.  Marland Kitchen Percocet [Oxycodone-Acetaminophen] Itching  . Vicodin [Hydrocodone-Acetaminophen] Itching    Review of systems: Review of systems negative except as noted in HPI / PMHx or noted below: Constitutional: Negative.  HENT: Negative.   Eyes: Negative.  Respiratory: Negative.   Cardiovascular: Negative.  Gastrointestinal: Negative.  Genitourinary: Negative.  Musculoskeletal: Negative.  Neurological: Negative.  Endo/Heme/Allergies: Negative.  Cutaneous: Negative.   Past Medical History:  Diagnosis Date  . Arthritis   . Asthma   . Diabetes mellitus   . Hypertension     Family History  Problem Relation Age of Onset  . Allergic rhinitis Neg Hx   . Asthma Neg Hx   . Eczema Neg Hx   . Urticaria Neg Hx     Social History   Socioeconomic History  . Marital status: Widowed    Spouse name: Not on file  . Number of children: Not on file  . Years of education: Not on file  . Highest education level: Not on file  Occupational History  . Not on file  Social Needs  . Financial resource strain: Not on file  . Food insecurity     Worry: Not on file    Inability: Not on file  . Transportation needs    Medical: Not on file    Non-medical: Not on file  Tobacco Use  . Smoking status: Former Smoker    Quit date: 07/19/1992    Years since quitting: 26.6  . Smokeless tobacco: Never Used  Substance and Sexual Activity  . Alcohol use: No    Comment: rarely  . Drug use: No  . Sexual activity: Yes    Birth control/protection: Surgical  Lifestyle  . Physical activity    Days per week: Not on file    Minutes per session: Not on file  . Stress: Not on file  Relationships  . Social Musician on phone: Not on file    Gets together: Not on file    Attends religious service: Not on file    Active member of club or organization: Not on file    Attends meetings of clubs or organizations: Not on file    Relationship status: Not on file  . Intimate partner violence    Fear of current or ex partner: Not on file  Emotionally abused: Not on file    Physically abused: Not on file    Forced sexual activity: Not on file  Other Topics Concern  . Not on file  Social History Narrative  . Not on file    I appreciate the opportunity to take part in [place] care. Please do not hesitate to contact me with questions.  Sincerely,   R. Edgar Frisk, MD

## 2019-03-25 NOTE — Assessment & Plan Note (Signed)
Stable.  Continue appropriate aeroallergen avoidance measures, azelastine nasal spray, 1-2 sprays per nostril 2 times daily as needed, plus/minus fluticasone nasal spray if needed.  Nasal saline spray (i.e., Simply Saline) or nasal saline lavage (i.e., NeilMed) is recommended as needed and prior to medicated nasal sprays. 

## 2019-03-25 NOTE — Assessment & Plan Note (Addendum)
Todays spirometry results, assessed while asymptomatic, suggest under-perception of bronchoconstriction.  A prescription has been provided for Spiriva Respimat 1.25 g, 2 inhalations daily.  Continue Symbicort (budesonide/formoterol) 160/4.5 g, 2 inhalations via spacer device twice a day, montelukast 10 mg daily at bedtime, albuterol every 4-6 hours if needed and 10 minutes prior to exercise.  Subjective and objective measures of pulmonary function will be followed and the treatment plan will be adjusted accordingly.

## 2019-03-30 ENCOUNTER — Telehealth: Payer: Self-pay | Admitting: Allergy and Immunology

## 2019-03-30 NOTE — Telephone Encounter (Signed)
Yes, Incruze is fine, 1 puff daily. Once she runs out of Symbicort we will switch her to [place] (instead of Symbi and incruze)  if it is affordable.

## 2019-03-30 NOTE — Telephone Encounter (Signed)
Patient called stating her out of pocket cost for her prescription Spirva 90-day supply was $285 which she could not manage at this time. Patient asked the pharmacy if there was another medication similar to [facility] that was more cost effective and the pharmacy told her to consult with her doctor. Patient wants to know if Dr. Verlin Fester could prescribe something less expensive.    Uses [facility] in [place] and [employer] as a [place].  Please advise.

## 2019-03-30 NOTE — Telephone Encounter (Signed)
According to formulary incruse is a tier 2

## 2019-03-31 ENCOUNTER — Other Ambulatory Visit: Payer: Self-pay

## 2019-03-31 MED ORDER — INCRUSE ELLIPTA 62.5 MCG/INH IN AEPB: 1.0000 | INHALATION_SPRAY | Freq: Every day | RESPIRATORY_TRACT | 5 refills | Status: DC

## 2019-03-31 NOTE — Telephone Encounter (Signed)
Sent in incruse and will inform pt

## 2019-05-07 ENCOUNTER — Other Ambulatory Visit: Payer: Self-pay | Admitting: *Deleted

## 2019-05-07 NOTE — Telephone Encounter (Signed)
PT states pharmacy gave her one inhaler and it cost around $75 which she cannot afford. Pharmacy says the rx is only valid for 1 month so she cannot get another inhaler.

## 2019-05-07 NOTE — Telephone Encounter (Signed)
I spoke with [facility] and the reason that they didn't dispense 3 inhalers is because the copay was $407 and they have to get approval from pt when its that high. That is why they only mailed out 1 inhaler and it was $45. Pt says she can not afford this and wants to know if we have any samples or if the doctor can switch her to something else.

## 2019-05-07 NOTE — Telephone Encounter (Signed)
Pt called saying [facility] only gave her 1 inhaler. We sent in a 90 day supply. I called [facility] and they instructed me to call back in an 1 hour because they were having technical difficulties.

## 2019-05-10 NOTE — Telephone Encounter (Signed)
Which inhaler was she taking, and what are other preferred options according to her insurance? Thanks.

## 2019-05-10 NOTE — Telephone Encounter (Signed)
Spiriva Respimat 1.25 is $45 which she states is too expensive. She is also using Symbicort 160. All inhalers on formulary are tier 3 and 4's.   Does the [place] or [place] office have any samples of Spiriva Respimat 1.25?

## 2019-05-10 NOTE — Telephone Encounter (Signed)
No samples available in the [facility] office.

## 2019-05-12 NOTE — Telephone Encounter (Signed)
Tina [name] has a sample and will have it in out [facility] office next week please call pt when sample gets here

## 2019-06-17 ENCOUNTER — Other Ambulatory Visit: Payer: Self-pay

## 2019-06-17 ENCOUNTER — Encounter: Payer: Self-pay | Admitting: Family Medicine

## 2019-06-17 ENCOUNTER — Ambulatory Visit (INDEPENDENT_AMBULATORY_CARE_PROVIDER_SITE_OTHER): Payer: Medicare HMO | Admitting: Family Medicine

## 2019-06-17 VITALS — BP 102/62 | HR 84 | Temp 97.3°F | Resp 20 | Ht 65.4 in | Wt 223.8 lb

## 2019-06-17 DIAGNOSIS — J3089 Other allergic rhinitis: Secondary | ICD-10-CM

## 2019-06-17 DIAGNOSIS — H1010 Acute atopic conjunctivitis, unspecified eye: Secondary | ICD-10-CM | POA: Diagnosis not present

## 2019-06-17 DIAGNOSIS — J4541 Moderate persistent asthma with (acute) exacerbation: Secondary | ICD-10-CM | POA: Diagnosis not present

## 2019-06-17 NOTE — Progress Notes (Addendum)
[place] [id number] Dept: [phone]  FOLLOW UP NOTE  Patient ID: Tina [name], female    DOB: Nov 04, 1945  Age: 73 y.o. MRN: [id number] Date of Office Visit: 06/17/2019  Assessment  Chief Complaint: Asthma, Shortness of Breath (x 2 days), and Medication Management (Symbicort 160 is $400 for 90 days supply.  had to decrease dose to 2 puffs once a day due to cost.  could not afford Spiriva.  not taking now.)  HPI Tina [name] is a 73 year old female who presents to the clinic for a follow up visit. She was last seen in this clinic on 03/25/2019 by Dr. Verlin Fester for evaluation of asthma and allergic rhinitis. At today's visit, she reports her asthma has not been well controlled with shortness of breath with activity, intermittent wheeze, and no cough. She reports that she is in "the doughnut hole" with her insurance and can not afford her asthma medications at this time. She has been using Symbicort 2 puffs once a day and is out of Spiriva. She did not get Incruse Ellipta filled. She continues taking montelukast 10 mg once a day and rarely uses her albuterol. She reports allergic rhinitis as well controlled with occasional Flonase. She is not currently using azelastine or nasal saline rinses. She reports poor technique with Flonase application. Reflux is reported as well controlled with omeprazole 20 mg once a day. Her current medications are listed in the chart.     Drug Allergies:  Allergies  Allergen Reactions  . Aleve [Naproxen]     Effects blood pressure  . Lactose Intolerance (Gi) Diarrhea  . Other     All Narcotics cause itching and rash.  Marland Kitchen Percocet [Oxycodone-Acetaminophen] Itching  . Vicodin [Hydrocodone-Acetaminophen] Itching    Physical Exam: BP 102/62 (BP Location: Left Arm, Patient Position: Sitting, Cuff Size: Large)   Pulse 84   Temp (!) 97.3 F (36.3 C) (Temporal)   Resp 20   Ht 5' 5.4" (1.661 m)   Wt 223 lb 12.8 oz (101.5 kg)   SpO2  97%   BMI 36.79 kg/m    Physical Exam Vitals reviewed.  Constitutional:      Appearance: She is well-developed.  HENT:     Head: Normocephalic and atraumatic.     Right Ear: Tympanic membrane normal.     Left Ear: Tympanic membrane normal.     Nose:     Comments: Bilateral nares edematous and pale with no nasal drainage noted. Pharynx normal. Ears normal. Eyes normal.    Mouth/Throat:     Pharynx: Oropharynx is clear.  Eyes:     Conjunctiva/sclera: Conjunctivae normal.  Cardiovascular:     Rate and Rhythm: Normal rate and regular rhythm.     Heart sounds: Normal heart sounds. No murmur.  Pulmonary:     Effort: Pulmonary effort is normal.     Breath sounds: Normal breath sounds.     Comments: Lungs clear to auscultation Musculoskeletal:        General: Normal range of motion.     Cervical back: Normal range of motion and neck supple.  Skin:    General: Skin is warm and dry.  Neurological:     Mental Status: She is alert and oriented to person, place, and time.  Psychiatric:        Mood and Affect: Mood normal.        Behavior: Behavior normal.        Thought Content: Thought content normal.  Judgment: Judgment normal.    Diagnostics: FVC 1.46, FEV1 0.89. Predicted FVC 2.45, predicted FEV1 1.90. Spirometry indicates moderate restriction and mild airway obstruction. Post bronchodilator therapy FVC 1.57, FEV1 0.96. Post bronchodilator therapy indicates no significant bronchodilator response. This is consistent with previous spirometry readings.    Assessment and Plan: 1. Moderate persistent asthma with acute exacerbation   2. Perennial allergic rhinitis   3. Seasonal allergic conjunctivitis     Patient Instructions  Asthma Continue Spiriva 1.25 mcg 2 puffs once a day to prevent cough or wheeze Continue Symbicort 2 puffs twice a day with a spacer to prevent cough or wheeze Continue albuterol 2 puffs once every 4 hours as needed for cough or wheeze If these  medications are not well covered by your insurance, consider changing to once a day Brestri inhaler  Allergic rhinitis Continue Flonase 2 sprays in each nostril once a day as needed for a stuffy nose.  In the right nostril, point the applicator out toward the right ear. In the left nostril, point the applicator out toward the left ear Continue azelastine 2 sprays in each nostril twice a day as needed for a runny nose Consider saline nasal rinses as needed for nasal symptoms. Use this before any medicated nasal sprays for best result  Reflux Continue omeprazole as previously prescribed  Continue dietary and lifestyle modifications as listed below  Call the clinic if this treatment plan is not working well for you  Follow up in 2 months or sooner if needed.   Return in about 2 months (around 08/15/2019), or if symptoms worsen or fail to improve.    Thank you for the opportunity to care for this patient.  Please do not hesitate to contact me with questions.  Thermon Leyland, FNP [facility]  ________________________________________________  I have provided oversight concerning [facility]'s evaluation and treatment of this patient's health issues addressed during today's encounter.  I agree with the assessment and therapeutic plan as outlined in the note.   Signed,   R Jorene Guest, MD

## 2019-06-17 NOTE — Patient Instructions (Addendum)
Asthma Continue Spiriva 1.25 mcg 2 puffs once a day to prevent cough or wheeze Continue Symbicort 2 puffs twice a day with a spacer to prevent cough or wheeze Continue albuterol 2 puffs once every 4 hours as needed for cough or wheeze If these medications are not well covered by your insurance, consider changing to once a day Brestri inhaler  Allergic rhinitis Continue Flonase 2 sprays in each nostril once a day as needed for a stuffy nose.  In the right nostril, point the applicator out toward the right ear. In the left nostril, point the applicator out toward the left ear Continue azelastine 2 sprays in each nostril twice a day as needed for a runny nose Consider saline nasal rinses as needed for nasal symptoms. Use this before any medicated nasal sprays for best result  Reflux Continue omeprazole as previously prescribed  Continue dietary and lifestyle modifications as listed below  Call the clinic if this treatment plan is not working well for you  Follow up in 2 months or sooner if needed.   Lifestyle Changes for Controlling GERD When you have GERD, stomach acid feels as if it's backing up toward your mouth. Whether or not you take medication to control your GERD, your symptoms can often be improved with lifestyle changes.   Raise Your Head  Reflux is more likely to strike when you're lying down flat, because stomach fluid can  flow backward more easily. Raising the head of your bed 4-6 inches can help. To do this:  Slide blocks or books under the legs at the head of your bed. Or, place a wedge under  the mattress. Many foam stores can make a suitable wedge for you. The wedge  should run from your waist to the top of your head.  Don't just prop your head on several pillows. This increases pressure on your  stomach. It can make GERD worse.  Watch Your Eating Habits Certain foods may increase the acid in your stomach or relax the lower esophageal sphincter, making GERD  more likely. It's best to avoid the following:  Coffee, tea, and carbonated drinks (with and without caffeine)  Fatty, fried, or spicy food  Mint, chocolate, onions, and tomatoes  Any other foods that seem to irritate your stomach or cause you pain  Relieve the Pressure  Eat smaller meals, even if you have to eat more often.  Don't lie down right after you eat. Wait a few hours for your stomach to empty.  Avoid tight belts and tight-fitting clothes.  Lose excess weight.  Tobacco and Alcohol  Avoid smoking tobacco and drinking alcohol. They can make GERD symptoms worse.

## 2019-06-17 NOTE — Addendum Note (Signed)
Addended by: Golda Acre C on: 06/17/2019 07:49 PM   Modules accepted: Level of Service

## 2019-06-18 MED ORDER — SPIRIVA RESPIMAT 1.25 MCG/ACT IN AERS: 2.0000 | INHALATION_SPRAY | Freq: Every day | RESPIRATORY_TRACT | 3 refills | Status: DC

## 2019-06-18 MED ORDER — BUDESONIDE-FORMOTEROL FUMARATE 160-4.5 MCG/ACT IN AERO: 2.0000 | INHALATION_SPRAY | Freq: Two times a day (BID) | RESPIRATORY_TRACT | 5 refills | Status: DC

## 2019-06-18 NOTE — Addendum Note (Signed)
Addended by: Hetty Blend on: 06/18/2019 08:13 PM   Modules accepted: Orders

## 2019-06-21 ENCOUNTER — Other Ambulatory Visit: Payer: Self-pay

## 2019-07-02 ENCOUNTER — Telehealth: Payer: Self-pay | Admitting: Family Medicine

## 2019-07-02 ENCOUNTER — Telehealth: Payer: Self-pay | Admitting: Nurse Practitioner

## 2019-07-02 NOTE — Telephone Encounter (Signed)
Patient has no fever, she is still able to taste and smell, no respiratory issues as of now. She does have a productive cough, almost like she has some drainage. She does not have a way to monitor her oxygen level. She said that she would need to order one. I let her know that I was not sure how long it would take to get one in so to just hold on it for now. She does have all of her medications, including her inhalers. I let her know to contact the monoclonal antibodies at [phone]. I also told her that if she developed severe shortness of breath, chest tightness that was not resolved with her inhalers or any other issues that she felt were severe enough to seek medical attention. I let her know to reach out to [place] if needed.

## 2019-07-02 NOTE — Telephone Encounter (Signed)
PT tested positive for covid on 1/14. Symptoms include body chills, diarrhea and headaches. No fever. PT was told by PCP to inform [facility] of her diagnosis.

## 2019-07-02 NOTE — Telephone Encounter (Signed)
Anne please refer to note taken by J[facility] front desk this morning.

## 2019-07-02 NOTE — Telephone Encounter (Signed)
Called to Discuss with patient about Covid symptoms and the use of bamlanivimab, a monoclonal antibody infusion for those with mild to moderate Covid symptoms and at a high risk of hospitalization.     Patient will be out of the 10 day window before she could be scheduled on Monday. Advised to go to the ED if symptoms worsen.

## 2019-07-02 NOTE — Telephone Encounter (Signed)
Patient called back. She did find a pulse oximeter. Her reading is 94%. I told her to check it about every hour or so and if she felt short of breath. Per Anne's instruction I told her that if her oxygen level gets down to 90-92% to call us back or to call the on call provider.

## 2019-07-03 ENCOUNTER — Telehealth: Payer: Self-pay | Admitting: Family Medicine

## 2019-07-03 NOTE — Telephone Encounter (Signed)
Patient reports that she is feeling somewhat better with no shortness of breath, cough or wheeze. She continues her maintenance medications. She denies fever. She verbalizes that she will call the on call number or 911 with worsening of symptoms.

## 2019-07-05 NOTE — Telephone Encounter (Signed)
Thank you :)

## 2019-09-20 ENCOUNTER — Ambulatory Visit (INDEPENDENT_AMBULATORY_CARE_PROVIDER_SITE_OTHER): Payer: Medicare PPO | Admitting: Allergy and Immunology

## 2019-09-20 ENCOUNTER — Other Ambulatory Visit: Payer: Self-pay

## 2019-09-20 ENCOUNTER — Encounter: Payer: Self-pay | Admitting: Allergy and Immunology

## 2019-09-20 VITALS — BP 142/68 | HR 84 | Temp 97.3°F | Resp 18 | Ht 64.0 in | Wt 221.8 lb

## 2019-09-20 DIAGNOSIS — J3089 Other allergic rhinitis: Secondary | ICD-10-CM

## 2019-09-20 DIAGNOSIS — J4541 Moderate persistent asthma with (acute) exacerbation: Secondary | ICD-10-CM | POA: Diagnosis not present

## 2019-09-20 MED ORDER — PREDNISONE 1 MG PO TABS: 10.0000 mg | ORAL_TABLET | Freq: Every day | ORAL | Status: DC

## 2019-09-20 MED ORDER — BUDESONIDE-FORMOTEROL FUMARATE 160-4.5 MCG/ACT IN AERO: 2.0000 | INHALATION_SPRAY | Freq: Two times a day (BID) | RESPIRATORY_TRACT | 5 refills | Status: DC

## 2019-09-20 NOTE — Assessment & Plan Note (Addendum)
With exacerbation.  Prednisone has been provided, 40 mg x3 days, 20 mg x1 day, 10 mg x1 day, then stop.  Increase Symbicort 160-4.5 g to 2 inhalations via spacer device twice a day rather than once a day.  Continue Spiriva 2 inhalations daily and montelukast 10 mg daily at bedtime.  Continue albuterol every 4-6 hours if needed.  The patient has been asked to contact me if her symptoms persist or progress. Otherwise, she may return for follow up in 4 months.

## 2019-09-20 NOTE — Patient Instructions (Addendum)
Moderate persistent asthma With exacerbation.  Prednisone has been provided, 40 mg x3 days, 20 mg x1 day, 10 mg x1 day, then stop.  Increase Symbicort 160-4.5 g to 2 inhalations via spacer device twice a day rather than once a day.  Continue Spiriva 2 inhalations daily and montelukast 10 mg daily at bedtime.  Continue albuterol every 4-6 hours if needed.  The patient has been asked to contact me if her symptoms persist or progress. Otherwise, she may return for follow up in 4 months.  Allergic rhinitis Stable.  Continue appropriate aeroallergen avoidance measures, azelastine nasal spray, 1-2 sprays per nostril 2 times daily as needed, plus/minus fluticasone nasal spray if needed.  Nasal saline spray (i.e., Simply Saline) or nasal saline lavage (i.e., NeilMed) is recommended as needed and prior to medicated nasal sprays.   Return in about 4 months (around 01/20/2020), or if symptoms worsen or fail to improve.

## 2019-09-20 NOTE — Assessment & Plan Note (Signed)
Stable.  Continue appropriate aeroallergen avoidance measures, azelastine nasal spray, 1-2 sprays per nostril 2 times daily as needed, plus/minus fluticasone nasal spray if needed.  Nasal saline spray (i.e., Simply Saline) or nasal saline lavage (i.e., NeilMed) is recommended as needed and prior to medicated nasal sprays.

## 2019-09-20 NOTE — Progress Notes (Signed)
Follow-up Note  RE: Tina [name] MRN: [id number] DOB: Apr 17, 1946 Date of Office Visit: 09/20/2019  Primary care provider: Patria Mane, MD Referring provider: Patria Mane, MD  History of present illness: Tina [name] is a 74 y.o. female with persistent asthma and allergic rhinitis presenting today for sick visit.  She was last seen in this clinic in October 2020.  She reports that within 24 hours of her second Moderna COVID-19 vaccine injection she began to experience chest tightness, dyspnea, and wheezing.  She reports that she "was fine" and that her asthma has been well controlled prior to the injection.  She received the injection on Wednesday, March 31 and has been experiencing lower respiratory symptoms since that time.  She reports that she has to stop after several steps due to dyspnea.  She has been taking Symbicort 160-4.5 g, 2 inhalations via spacer device once daily, and Spiriva 2 inhalations daily.  She has been requiring albuterol rescue daily over the past few days and was contemplating going to the emergency department this weekend.  She has no nasal allergy symptom complaints today.  Assessment and plan: Moderate persistent asthma With exacerbation.  Prednisone has been provided, 40 mg x3 days, 20 mg x1 day, 10 mg x1 day, then stop.  Increase Symbicort 160-4.5 g to 2 inhalations via spacer device twice a day rather than once a day.  Continue Spiriva 2 inhalations daily and montelukast 10 mg daily at bedtime.  Continue albuterol every 4-6 hours if needed.  The patient has been asked to contact me if her symptoms persist or progress. Otherwise, she may return for follow up in 4 months.  Allergic rhinitis Stable.  Continue appropriate aeroallergen avoidance measures, azelastine nasal spray, 1-2 sprays per nostril 2 times daily as needed, plus/minus fluticasone nasal spray if needed.  Nasal saline spray (i.e., Simply Saline) or nasal saline lavage (i.e.,  NeilMed) is recommended as needed and prior to medicated nasal sprays.   Meds ordered this encounter  Medications  . budesonide-formoterol (SYMBICORT) 160-4.5 MCG/ACT inhaler    Sig: Inhale 2 puffs into the lungs 2 (two) times daily.    Dispense:  1 Inhaler    Refill:  5  . predniSONE (DELTASONE) tablet 10 mg    Diagnostics: Spirometry reveals an FVC of 1.42 L (60% predicted) and FEV1 of 0.89 L (49% predicted) with 10% postbronchodilator improvement.  Please see scanned spirometry results for details.    Physical examination: Blood pressure (!) 142/68, pulse 84, temperature (!) 97.3 F (36.3 C), temperature source Temporal, resp. rate 18, height 5\' 4"  (1.626 m), weight 221 lb 12.8 oz (100.6 kg), SpO2 97 %.  General: Alert, interactive, in no acute distress. HEENT: TMs pearly gray, turbinates mildly edematous without discharge, post-pharynx mildly erythematous. Neck: Supple without lymphadenopathy. Lungs: Mildly decreased breath sounds bilaterally without wheezing, rhonchi or rales. CV: Normal S1, S2 without murmurs. Skin: Warm and dry, without lesions or rashes.  The following portions of the patient's history were reviewed and updated as appropriate: allergies, current medications, past family history, past medical history, past social history, past surgical history and problem list.  Current Outpatient Medications  Medication Sig Dispense Refill  . albuterol (PROVENTIL) (2.5 MG/3ML) 0.083% nebulizer solution Inhale into the lungs.    albuterol (VENTOLIN HFA) 108 (90 Base) MCG/ACT inhaler Inhale 2 puffs into the lungs every 6 (six) hours as needed for wheezing or shortness of breath.    . Azelastine HCl 0.15 % SOLN Place 1-2 sprays into  both nostrils 2 (two) times daily. 30 mL 5  . budesonide-formoterol (SYMBICORT) 160-4.5 MCG/ACT inhaler Inhale 2 puffs into the lungs 2 (two) times daily. 1 Inhaler 5  . montelukast (SINGULAIR) 10 MG tablet Take 1 tablet (10 mg total) by mouth at  bedtime. 30 tablet 5  . Tiotropium Bromide Monohydrate (SPIRIVA RESPIMAT) 1.25 MCG/ACT AERS Inhale 2 puffs into the lungs daily. 12 g 3  . Accu-Chek Softclix Lancets lancets TEST TWICE A DAY    . aspirin 81 MG tablet Take 325 mg by mouth daily.     Marland Kitchen atorvastatin (LIPITOR) 40 MG tablet Take 40 mg by mouth daily.    . betamethasone acetate-betamethasone sodium phosphate (CELESTONE) 6 (3-3) MG/ML injection     . BIOTIN PO Take by mouth.    . Calcium-Vitamin D-Vitamin K (VIACTIV) 132-440-10 MG-UNT-MCG CHEW Chew 2 tablets by mouth daily.    . clopidogrel (PLAVIX) 75 MG tablet Take 75 mg by mouth daily.    Marland Kitchen gabapentin (NEURONTIN) 300 MG capsule Take 300 mg by mouth 3 (three) times daily.    Marland Kitchen glipiZIDE (GLUCOTROL XL) 5 MG 24 hr tablet     . glucose blood (ACCU-CHEK AVIVA PLUS) test strip USE TO TEST BLOOD SUGAR TWICE DAILY    . IRON CR PO Take by mouth.    Marland Kitchen omeprazole (PRILOSEC) 20 MG capsule Take 20 mg by mouth daily.     . potassium chloride (K-DUR,KLOR-CON) 10 MEQ tablet Take 10 mEq by mouth daily.    Marland Kitchen pyridoxine (B-6) 100 MG tablet Take by mouth.    . sertraline (ZOLOFT) 25 MG tablet Take 25 mg by mouth daily.    Marland Kitchen umeclidinium bromide (INCRUSE ELLIPTA) 62.5 MCG/INH AEPB Inhale 1 puff into the lungs daily. (Patient not taking: Reported on 06/17/2019) 7 each 5  . valsartan-hydrochlorothiazide (DIOVAN-HCT) 160-25 MG tablet     . vitamin C (ASCORBIC ACID) 500 MG tablet Take 500 mg by mouth daily.     Current Facility-Administered Medications  Medication Dose Route Frequency Provider Last Rate Last Admin  . predniSONE (DELTASONE) tablet 10 mg  10 mg Oral Q breakfast Callen Vancuren, [place] Muta, MD        Allergies  Allergen Reactions  . Aleve [Naproxen]     Effects blood pressure  . Lactose Intolerance (Gi) Diarrhea  . Other     All Narcotics cause itching and rash.  Marland Kitchen Percocet [Oxycodone-Acetaminophen] Itching  . Vicodin [Hydrocodone-Acetaminophen] Itching   Review of systems: Review of  systems negative except as noted in HPI / PMHx.  Past Medical History:  Diagnosis Date  . Arthritis   . Asthma   . Diabetes mellitus   . Hypertension     Family History  Problem Relation Age of Onset  . Allergic rhinitis Neg Hx   . Asthma Neg Hx   . Eczema Neg Hx   . Urticaria Neg Hx     Social History   Socioeconomic History  . Marital status: Widowed    Spouse name: Not on file  . Number of children: Not on file  . Years of education: Not on file  . Highest education level: Not on file  Occupational History  . Not on file  Tobacco Use  . Smoking status: Former Smoker    Packs/day: 1.00    Years: 20.00    Pack years: 20.00    Types: Cigarettes    Quit date: 07/19/1992    Years since quitting: 27.1  . Smokeless tobacco: Never Used  Substance and Sexual Activity  . Alcohol use: No    Comment: rarely  . Drug use: No  . Sexual activity: Yes    Birth control/protection: Surgical  Other Topics Concern  . Not on file  Social History Narrative  . Not on file   Social Determinants of Health   Financial Resource Strain:   . Difficulty of Paying Living Expenses:   Food Insecurity:   . Worried About Programme researcher, broadcasting/film/video in the Last Year:   . Barista in the Last Year:   Transportation Needs:   . Freight forwarder (Medical):   Marland Kitchen Lack of Transportation (Non-Medical):   Physical Activity:   . Days of Exercise per Week:   . Minutes of Exercise per Session:   Stress:   . Feeling of Stress :   Social Connections:   . Frequency of Communication with Friends and Family:   . Frequency of Social Gatherings with Friends and Family:   . Attends Religious Services:   . Active Member of Clubs or Organizations:   . Attends Banker Meetings:   Marland Kitchen Marital Status:   Intimate Partner Violence:   . Fear of Current or Ex-Partner:   . Emotionally Abused:   Marland Kitchen Physically Abused:   . Sexually Abused:     I appreciate the opportunity to take part in  Boni's care. Please do not hesitate to contact me with questions.  Sincerely,   R. Jorene Guest, MD

## 2019-09-21 ENCOUNTER — Encounter: Payer: Self-pay | Admitting: Allergy and Immunology

## 2019-11-08 ENCOUNTER — Telehealth: Payer: Self-pay | Admitting: Allergy and Immunology

## 2019-11-08 NOTE — Telephone Encounter (Signed)
Yes thanks 

## 2019-11-08 NOTE — Telephone Encounter (Signed)
Patient currently takes  Azelastine she states that she is in need of something more cost effective.  Please advise on an alternative solution.

## 2019-11-08 NOTE — Telephone Encounter (Signed)
It seems pt has not been on flonase could we try sending that in for pt to try?

## 2019-11-09 ENCOUNTER — Other Ambulatory Visit: Payer: Self-pay

## 2019-11-09 MED ORDER — FLUTICASONE PROPIONATE 50 MCG/ACT NA SUSP: 2.0000 | Freq: Every day | NASAL | 5 refills | Status: DC

## 2019-11-09 NOTE — Telephone Encounter (Signed)
Sent in flonase for pt, pt informed and stated understanding and sent a thank you for changing the med for her

## 2020-01-03 ENCOUNTER — Other Ambulatory Visit: Payer: Self-pay

## 2020-01-03 ENCOUNTER — Encounter: Payer: Self-pay | Admitting: Allergy and Immunology

## 2020-01-03 ENCOUNTER — Ambulatory Visit (INDEPENDENT_AMBULATORY_CARE_PROVIDER_SITE_OTHER): Payer: Medicare PPO | Admitting: Allergy and Immunology

## 2020-01-03 VITALS — BP 152/72 | HR 78 | Temp 98.7°F | Resp 20

## 2020-01-03 DIAGNOSIS — J4541 Moderate persistent asthma with (acute) exacerbation: Secondary | ICD-10-CM

## 2020-01-03 DIAGNOSIS — H1010 Acute atopic conjunctivitis, unspecified eye: Secondary | ICD-10-CM

## 2020-01-03 DIAGNOSIS — K219 Gastro-esophageal reflux disease without esophagitis: Secondary | ICD-10-CM | POA: Diagnosis not present

## 2020-01-03 DIAGNOSIS — J3089 Other allergic rhinitis: Secondary | ICD-10-CM | POA: Diagnosis not present

## 2020-01-03 DIAGNOSIS — R05 Cough: Secondary | ICD-10-CM

## 2020-01-03 DIAGNOSIS — R053 Chronic cough: Secondary | ICD-10-CM

## 2020-01-03 MED ORDER — PREDNISONE 1 MG PO TABS: 10.0000 mg | ORAL_TABLET | Freq: Every day | ORAL | Status: DC

## 2020-01-03 MED ORDER — OLOPATADINE HCL 0.2 % OP SOLN
1.0000 [drp] | Freq: Every day | OPHTHALMIC | 5 refills | Status: AC | PRN
Start: 2020-01-03 — End: ?

## 2020-01-03 MED ORDER — FLOVENT HFA 110 MCG/ACT IN AERO
2.0000 | INHALATION_SPRAY | Freq: Two times a day (BID) | RESPIRATORY_TRACT | 5 refills | Status: DC
Start: 2020-01-03 — End: 2020-09-12

## 2020-01-03 NOTE — Assessment & Plan Note (Signed)
   A laboratory order form has been provided for total IgE level and CBC with differential to assess the patient's candidacy for biologic agents.  Prednisone has been provided, 20 mg x 4 days, 10 mg x1 day, then stop.  The patient has been asked not to start the prednisone until after her labs have been drawn.  For now, continue Symbicort 160-4.5 g to 2 inhalations via spacer device twice a day rather than once a day.  Continue Spiriva 2 inhalations daily and montelukast 10 mg daily at bedtime.  Continue albuterol every 4-6 hours if needed.  For now, and during respiratory tract infections or asthma flares, add Flovent 110g 2 inhalations via spacer device 2 times per day until symptoms have returned to baseline.  A prescription has been provided.  The patient has been asked to contact me if her symptoms persist or progress. Otherwise, she may return for follow up in 4 months.

## 2020-01-03 NOTE — Assessment & Plan Note (Signed)
   Appropriate reflux lifestyle modifications have been provided in written form.  Continue omeprazole as prescribed.  For now, add famotidine (Pepcid) 20 mg twice daily.

## 2020-01-03 NOTE — Patient Instructions (Addendum)
Moderate persistent asthma  A laboratory order form has been provided for total IgE level and CBC with differential to assess the patient's candidacy for biologic agents.  Prednisone has been provided, 20 mg x 4 days, 10 mg x1 day, then stop.  The patient has been asked not to start the prednisone until after her labs have been drawn.  For now, continue Symbicort 160-4.5 g to 2 inhalations via spacer device twice a day rather than once a day.  Continue Spiriva 2 inhalations daily and montelukast 10 mg daily at bedtime.  Continue albuterol every 4-6 hours if needed.  For now, and during respiratory tract infections or asthma flares, add Flovent 110g 2 inhalations via spacer device 2 times per day until symptoms have returned to baseline.  A prescription has been provided.  The patient has been asked to contact me if her symptoms persist or progress. Otherwise, she may return for follow up in 4 months.  Allergic rhinitis  Continue appropriate aeroallergen avoidance measures.  Continue azelastine nasal spray, 1-2 sprays per nostril 2 times daily as needed, plus/minus fluticasone nasal spray if needed.  I have recommended nasal saline lavage (i.e., NeilMed) is recommended as needed and prior to medicated nasal sprays.  For now, add guaifenesin [phone] mg (Mucinex in the blue box)  twice daily as needed with adequate hydration as discussed.  Seasonal allergic conjunctivitis  Treatment plan as outlined above for allergic rhinitis.  A prescription has been provided for generic Pataday, one drop per eye daily as needed.  If insurance does not cover this medication, medicated allergy eyedrops may be purchased over-the-counter as Art therapist.  I have also recommended eye lubricant drops (i.e., Natural Tears) as needed.  GERD (gastroesophageal reflux disease)  Appropriate reflux lifestyle modifications have been provided in written form.  Continue omeprazole as  prescribed.  For now, add famotidine (Pepcid) 20 mg twice daily.  Cough, persistent Most likely multifactorial.  Treatment plan as outlined above.   Return in about 4 months (around 05/05/2020), or if symptoms worsen or fail to improve.  Lifestyle Changes for Controlling GERD  When you have GERD, stomach acid feels as if it's backing up toward your mouth. Whether or not you take medication to control your GERD, your symptoms can often be improved with lifestyle changes.   Raise Your Head  Reflux is more likely to strike when you're lying down flat, because stomach fluid can  flow backward more easily. Raising the head of your bed 4-6 inches can help. To do this:  Slide blocks or books under the legs at the head of your bed. Or, place a wedge under  the mattress. Many foam stores can make a suitable wedge for you. The wedge  should run from your waist to the top of your head.  Don't just prop your head on several pillows. This increases pressure on your  stomach. It can make GERD worse.  Watch Your Eating Habits Certain foods may increase the acid in your stomach or relax the lower esophageal sphincter, making GERD more likely. It's best to avoid the following:  Coffee, tea, and carbonated drinks (with and without caffeine)  Fatty, fried, or spicy food  Mint, chocolate, onions, and tomatoes  Any other foods that seem to irritate your stomach or cause you pain  Relieve the Pressure  Eat smaller meals, even if you have to eat more often.  Don't lie down right after you eat. Wait a few hours for your  stomach to empty.  Avoid tight belts and tight-fitting clothes.  Lose excess weight.  Tobacco and Alcohol  Avoid smoking tobacco and drinking alcohol. They can make GERD symptoms worse.

## 2020-01-03 NOTE — Assessment & Plan Note (Signed)
   Treatment plan as outlined above for allergic rhinitis.  A prescription has been provided for generic Pataday, one drop per eye daily as needed.  If insurance does not cover this medication, medicated allergy eyedrops may be purchased over-the-counter as Pataday Extra Strength or Zaditor.  I have also recommended eye lubricant drops (i.e., Natural Tears) as needed. 

## 2020-01-03 NOTE — Assessment & Plan Note (Signed)
   Continue appropriate aeroallergen avoidance measures.  Continue azelastine nasal spray, 1-2 sprays per nostril 2 times daily as needed, plus/minus fluticasone nasal spray if needed.  I have recommended nasal saline lavage (i.e., NeilMed) is recommended as needed and prior to medicated nasal sprays.  For now, add guaifenesin [phone] mg (Mucinex in the blue box)  twice daily as needed with adequate hydration as discussed.

## 2020-01-03 NOTE — Assessment & Plan Note (Signed)
Most likely multifactorial.  Treatment plan as outlined above. 

## 2020-01-03 NOTE — Progress Notes (Signed)
Follow-up Note  RE: Tina [name] MRN: [id number] DOB: 08-Nov-1945 Date of Office Visit: 01/03/2020  Primary care provider: Patria Mane, MD Referring provider: Patria Mane, MD  History of present illness: Tina [name] is a 74 y.o. female with persistent asthma and allergic rhinitis presenting today for sick visit.  She was last seen in this clinic on September 20, 2019.  She reports that approximately 2 weeks ago she began to experience ocular pruritus and some postnasal drainage.  Shortly thereafter, she began to experience a productive cough as well as some wheezing.  She has been requiring albuterol rescue multiple times over the past few days.  She has been compliant with Symbicort 160-4.5 g, 2 inhalations via spacer device, and Spiriva 1.25 g 2 inhalations daily.  She is using azelastine nasal spray daily.  She admits that she has not been using nasal saline or fluticasone nasal spray.  She reports that her acid reflux has not been well controlled despite compliance with omeprazole 20 mg daily.  Assessment and plan: Moderate persistent asthma  A laboratory order form has been provided for total IgE level and CBC with differential to assess the patient's candidacy for biologic agents.  Prednisone has been provided, 20 mg x 4 days, 10 mg x1 day, then stop.  The patient has been asked not to start the prednisone until after her labs have been drawn.  For now, continue Symbicort 160-4.5 g to 2 inhalations via spacer device twice a day rather than once a day.  Continue Spiriva 2 inhalations daily and montelukast 10 mg daily at bedtime.  Continue albuterol every 4-6 hours if needed.  For now, and during respiratory tract infections or asthma flares, add Flovent 110g 2 inhalations via spacer device 2 times per day until symptoms have returned to baseline.  A prescription has been provided.  The patient has been asked to contact me if her symptoms persist or progress.  Otherwise, she may return for follow up in 4 months.  Allergic rhinitis  Continue appropriate aeroallergen avoidance measures.  Continue azelastine nasal spray, 1-2 sprays per nostril 2 times daily as needed, plus/minus fluticasone nasal spray if needed.  I have recommended nasal saline lavage (i.e., NeilMed) is recommended as needed and prior to medicated nasal sprays.  For now, add guaifenesin [phone] mg (Mucinex in the blue box)  twice daily as needed with adequate hydration as discussed.  Seasonal allergic conjunctivitis  Treatment plan as outlined above for allergic rhinitis.  A prescription has been provided for generic Pataday, one drop per eye daily as needed.  If insurance does not cover this medication, medicated allergy eyedrops may be purchased over-the-counter as Art therapist.  I have also recommended eye lubricant drops (i.e., Natural Tears) as needed.  GERD (gastroesophageal reflux disease)  Appropriate reflux lifestyle modifications have been provided in written form.  Continue omeprazole as prescribed.  For now, add famotidine (Pepcid) 20 mg twice daily.  Cough, persistent Most likely multifactorial.  Treatment plan as outlined above.   Meds ordered this encounter  Medications  . fluticasone (FLOVENT HFA) 110 MCG/ACT inhaler    Sig: Inhale 2 puffs into the lungs 2 (two) times daily. During asthma flares.    Dispense:  1 Inhaler    Refill:  5  . Olopatadine HCl (PATADAY) 0.2 % SOLN    Sig: Place 1 drop into both eyes daily as needed.    Dispense:  2.5 mL    Refill:  5  .  predniSONE (DELTASONE) tablet 10 mg    Diagnostics: Spirometry reveals an FVC of 1.52 L and an FEV1 of 0.89 L (49% predicted) with 10% postbronchodilator improvement.  She has demonstrated full reversibility on previous studies.  Please see scanned spirometry results for details.    Physical examination: Blood pressure (!) 152/72, pulse 78, temperature 98.7 F  (37.1 C), temperature source Oral, resp. rate 20, SpO2 98 %.  General: Alert, interactive, in no acute distress. HEENT: TMs pearly gray, turbinates moderately edematous without discharge, post-pharynx erythematous. Neck: Supple without lymphadenopathy. Lungs: Mildly decreased breath sounds with expiratory wheezing bilaterally. CV: Normal S1, S2 without murmurs. Skin: Warm and dry, without lesions or rashes.  The following portions of the patient's history were reviewed and updated as appropriate: allergies, current medications, past family history, past medical history, past social history, past surgical history and problem list.  Current Outpatient Medications  Medication Sig Dispense Refill  . Accu-Chek Softclix Lancets lancets TEST TWICE A DAY    . albuterol (VENTOLIN HFA) 108 (90 Base) MCG/ACT inhaler Inhale 2 puffs into the lungs every 6 (six) hours as needed for wheezing or shortness of breath.    Marland Kitchen aspirin 81 MG tablet Take 325 mg by mouth daily.     Marland Kitchen atorvastatin (LIPITOR) 80 MG tablet Take by mouth.    . Azelastine HCl 0.15 % SOLN Place 1-2 sprays into both nostrils 2 (two) times daily. 30 mL 5  . BIOTIN PO Take by mouth.    . budesonide-formoterol (SYMBICORT) 160-4.5 MCG/ACT inhaler Inhale 2 puffs into the lungs 2 (two) times daily. 1 Inhaler 5  . Calcium Carb-Cholecalciferol (OYSTER SHELL CALCIUM) 500-400 MG-UNIT TABS Take 1 tablet by mouth daily.    . Calcium-Vitamin D-Vitamin K (VIACTIV) 500-100-40 MG-UNT-MCG CHEW Chew 2 tablets by mouth daily.    . clopidogrel (PLAVIX) 75 MG tablet Take 75 mg by mouth daily.    Marland Kitchen gabapentin (NEURONTIN) 300 MG capsule Take 300 mg by mouth 3 (three) times daily.    Marland Kitchen glipiZIDE (GLUCOTROL XL) 5 MG 24 hr tablet     . glucose blood (ACCU-CHEK AVIVA PLUS) test strip USE TO TEST BLOOD SUGAR TWICE DAILY    . IRON CR PO Take by mouth.    . montelukast (SINGULAIR) 10 MG tablet Take 1 tablet (10 mg total) by mouth at bedtime. 30 tablet 5  .  omeprazole (PRILOSEC) 20 MG capsule Take 20 mg by mouth daily.     Bertram Gala Glycol-Propyl Glycol (SYSTANE OP) Place 1 drop into both eyes daily as needed.    . potassium chloride (K-DUR,KLOR-CON) 10 MEQ tablet Take 10 mEq by mouth daily.    Marland Kitchen pyridoxine (B-6) 100 MG tablet Take by mouth. B12    . sertraline (ZOLOFT) 25 MG tablet Take 25 mg by mouth daily.    . Tiotropium Bromide Monohydrate (SPIRIVA RESPIMAT) 1.25 MCG/ACT AERS Inhale 2 puffs into the lungs daily. 12 g 3  . valsartan-hydrochlorothiazide (DIOVAN-HCT) 160-25 MG tablet     . vitamin C (ASCORBIC ACID) 500 MG tablet Take 500 mg by mouth daily.    Marland Kitchen albuterol (PROVENTIL) (2.5 MG/3ML) 0.083% nebulizer solution Inhale into the lungs.    . fluticasone (FLONASE) 50 MCG/ACT nasal spray Place 2 sprays into both nostrils daily. (Patient not taking: Reported on 01/03/2020) 18.2 mL 5  . fluticasone (FLOVENT HFA) 110 MCG/ACT inhaler Inhale 2 puffs into the lungs 2 (two) times daily. During asthma flares. 1 Inhaler 5  . Olopatadine HCl (PATADAY) 0.2 %  SOLN Place 1 drop into both eyes daily as needed. 2.5 mL 5   Current Facility-Administered Medications  Medication Dose Route Frequency Provider Last Rate Last Admin  . predniSONE (DELTASONE) tablet 10 mg  10 mg Oral Q breakfast Deshonna Trnka, Heywood Iles, MD      . Melene Muller ON 01/04/2020] predniSONE (DELTASONE) tablet 10 mg  10 mg Oral Q breakfast Ocean Kearley, Heywood Iles, MD        Allergies  Allergen Reactions  . Aleve [Naproxen]     Effects blood pressure  . Lactose Intolerance (Gi) Diarrhea  . Other     All Narcotics cause itching and rash.  Marland Kitchen Percocet [Oxycodone-Acetaminophen] Itching  . Vicodin [Hydrocodone-Acetaminophen] Itching   Review of systems: Review of systems negative except as noted in HPI / PMHx.  Past Medical History:  Diagnosis Date  . Arthritis   . Asthma   . Diabetes mellitus   . Hypertension     Family History  Problem Relation Age of Onset  . Allergic rhinitis Neg  Hx   . Asthma Neg Hx   . Eczema Neg Hx   . Urticaria Neg Hx     Social History   Socioeconomic History  . Marital status: Widowed    Spouse name: Not on file  . Number of children: Not on file  . Years of education: Not on file  . Highest education level: Not on file  Occupational History  . Not on file  Tobacco Use  . Smoking status: Former Smoker    Packs/day: 1.00    Years: 20.00    Pack years: 20.00    Types: Cigarettes    Quit date: 07/19/1992    Years since quitting: 27.4  . Smokeless tobacco: Never Used  Vaping Use  . Vaping Use: Never used  Substance and Sexual Activity  . Alcohol use: No    Comment: rarely  . Drug use: No  . Sexual activity: Yes    Birth control/protection: Surgical  Other Topics Concern  . Not on file  Social History Narrative  . Not on file   Social Determinants of Health   Financial Resource Strain:   . Difficulty of Paying Living Expenses:   Food Insecurity:   . Worried About Programme researcher, broadcasting/film/video in the Last Year:   . Barista in the Last Year:   Transportation Needs:   . Freight forwarder (Medical):   Marland Kitchen Lack of Transportation (Non-Medical):   Physical Activity:   . Days of Exercise per Week:   . Minutes of Exercise per Session:   Stress:   . Feeling of Stress :   Social Connections:   . Frequency of Communication with Friends and Family:   . Frequency of Social Gatherings with Friends and Family:   . Attends Religious Services:   . Active Member of Clubs or Organizations:   . Attends Banker Meetings:   Marland Kitchen Marital Status:   Intimate Partner Violence:   . Fear of Current or Ex-Partner:   . Emotionally Abused:   Marland Kitchen Physically Abused:   . Sexually Abused:     I appreciate the opportunity to take part in Shailene's care. Please do not hesitate to contact me with questions.  Sincerely,   R. Jorene Guest, MD

## 2020-01-06 LAB — CBC WITH DIFFERENTIAL/PLATELET
Basophils Absolute: 0 10*3/uL (ref 0.0–0.2)
Basos: 1 %
EOS (ABSOLUTE): 0.3 10*3/uL (ref 0.0–0.4)
Eos: 4 %
Hematocrit: 35.4 % (ref 34.0–46.6)
Hemoglobin: 11.2 g/dL (ref 11.1–15.9)
Immature Grans (Abs): 0 10*3/uL (ref 0.0–0.1)
Immature Granulocytes: 0 %
Lymphocytes Absolute: 1.8 10*3/uL (ref 0.7–3.1)
Lymphs: 25 %
MCH: 26.4 pg — ABNORMAL LOW (ref 26.6–33.0)
MCHC: 31.6 g/dL (ref 31.5–35.7)
MCV: 84 fL (ref 79–97)
Monocytes Absolute: 0.7 10*3/uL (ref 0.1–0.9)
Monocytes: 10 %
Neutrophils Absolute: 4.3 10*3/uL (ref 1.4–7.0)
Neutrophils: 60 %
Platelets: 272 10*3/uL (ref 150–450)
RBC: 4.24 x10E6/uL (ref 3.77–5.28)
RDW: 12.7 % (ref 11.7–15.4)
WBC: 7.2 10*3/uL (ref 3.4–10.8)

## 2020-01-06 LAB — IGE: IgE (Immunoglobulin E), Serum: 80 IU/mL (ref 6–495)

## 2020-01-07 ENCOUNTER — Telehealth: Payer: Self-pay | Admitting: *Deleted

## 2020-01-07 NOTE — Telephone Encounter (Signed)
-----   Message from Cristal Ford, MD sent at 01/06/2020  9:32 PM EDT ----- The patient's labs indicate that she is a good candidate for IL-5 therapy.  Please discuss Harrington Challenger with the patient and submit paperwork if she is amenable. Please send the results to the patient's primary care physician. Thanks.

## 2020-01-07 NOTE — Telephone Encounter (Signed)
Spoke to patient and and explained patient assistance and will mail app for her to sign and return to me

## 2020-01-07 NOTE — Telephone Encounter (Signed)
L/M for patient to contact me to discuss patient assistance for Harrington Challenger due to her [facility] plan

## 2020-01-20 ENCOUNTER — Ambulatory Visit (INDEPENDENT_AMBULATORY_CARE_PROVIDER_SITE_OTHER): Payer: Medicare PPO | Admitting: Allergy and Immunology

## 2020-01-20 ENCOUNTER — Other Ambulatory Visit: Payer: Self-pay

## 2020-01-20 ENCOUNTER — Encounter: Payer: Self-pay | Admitting: Allergy and Immunology

## 2020-01-20 VITALS — BP 110/72 | HR 75 | Temp 97.6°F | Resp 16

## 2020-01-20 DIAGNOSIS — H1010 Acute atopic conjunctivitis, unspecified eye: Secondary | ICD-10-CM | POA: Diagnosis not present

## 2020-01-20 DIAGNOSIS — K219 Gastro-esophageal reflux disease without esophagitis: Secondary | ICD-10-CM

## 2020-01-20 DIAGNOSIS — J4540 Moderate persistent asthma, uncomplicated: Secondary | ICD-10-CM

## 2020-01-20 DIAGNOSIS — J3089 Other allergic rhinitis: Secondary | ICD-10-CM

## 2020-01-20 NOTE — Patient Instructions (Addendum)
Moderate persistent asthma  Insurance paperwork has been submitted for [employer].  She will start Fasenra injections once the approval process is completed.  For now, continue Symbicort 160-4.5 g to 2 inhalations via spacer device twice a day rather than once a day.  Continue Spiriva 2 inhalations daily and montelukast 10 mg daily at bedtime.  Continue albuterol every 4-6 hours if needed.  During respiratory tract infections or asthma flares, add Flovent 110g 2 inhalations via spacer device 2 times per day until symptoms have returned to baseline.  A prescription has been provided.  Subjective and objective measures of pulmonary function will be followed and the treatment plan will be adjusted accordingly.  Allergic rhinitis  Continue appropriate aeroallergen avoidance measures.  Continue azelastine nasal spray, 1-2 sprays per nostril 2 times daily as needed, plus/minus fluticasone nasal spray if needed.  Nasal saline lavage (i.e., NeilMed) is recommended as needed and prior to medicated nasal sprays.  For thick postnasal drainage, add guaifenesin [phone] mg (Mucinex in the blue box)  twice daily as needed with adequate hydration as discussed.  Seasonal allergic conjunctivitis  Treatment plan as outlined above for allergic rhinitis.  Pataday or Zaditor as needed.  Eye lubricant drops (Natural Tears) as needed.  GERD (gastroesophageal reflux disease)  Appropriate reflux lifestyle modifications have been provided in written form.  Continue omeprazole as prescribed.  For now, continue famotidine (Pepcid) 20 mg twice daily.   Return in about 3 months (around 04/21/2020), or if symptoms worsen or fail to improve.

## 2020-01-20 NOTE — Assessment & Plan Note (Signed)
   Appropriate reflux lifestyle modifications have been provided in written form.  Continue omeprazole as prescribed.  For now, continue famotidine (Pepcid) 20 mg twice daily.

## 2020-01-20 NOTE — Assessment & Plan Note (Signed)
   Treatment plan as outlined above for allergic rhinitis.  Pataday or Zaditor as needed.  Eye lubricant drops (Natural Tears) as needed.

## 2020-01-20 NOTE — Assessment & Plan Note (Signed)
   Continue appropriate aeroallergen avoidance measures.  Continue azelastine nasal spray, 1-2 sprays per nostril 2 times daily as needed, plus/minus fluticasone nasal spray if needed.  Nasal saline lavage (i.e., NeilMed) is recommended as needed and prior to medicated nasal sprays.  For thick postnasal drainage, add guaifenesin [phone] mg (Mucinex in the blue box)  twice daily as needed with adequate hydration as discussed.

## 2020-01-20 NOTE — Progress Notes (Signed)
Follow-up Note  RE: Tina [name] MRN: [id number] DOB: April 06, 1946 Date of Office Visit: 01/20/2020  Primary care provider: Patria Mane, MD Referring provider: Patria Mane, MD  History of present illness: Tina [name] is a 74 y.o. female with persistent asthma and allergic rhinitis presenting today for follow-up.  She was last seen in this clinic on January 03, 2020 for an asthma exacerbation.  She reports that her asthma symptoms have improved significantly in the interval since her previous visit.  She is currently taking Symbicort 160-4.5 g, 2 inhalations via spacer device twice daily, Spiriva 2 inhalations daily, and albuterol when needed.  Her albuterol requirement has decreased significantly and she is no longer experiencing nocturnal awakenings due to lower respiratory symptoms and her cough has improved.    Tina [name] is interested in starting Fasenra injections to reduce symptoms and asthma controller medication requirement.  Her nasal allergy symptoms are reported as controlled with nasal saline rinse daily, fluticasone nasal spray daily, and guaifenesin when needed.  She has no reflux related complaints today.  She takes omeprazole daily and famotidine twice daily.  Assessment and plan: Moderate persistent asthma  Insurance paperwork has been submitted for [employer].  She will start Fasenra injections once the approval process is completed.  For now, continue Symbicort 160-4.5 g to 2 inhalations via spacer device twice a day rather than once a day.  Continue Spiriva 2 inhalations daily and montelukast 10 mg daily at bedtime.  Continue albuterol every 4-6 hours if needed.  During respiratory tract infections or asthma flares, add Flovent 110g 2 inhalations via spacer device 2 times per day until symptoms have returned to baseline.  A prescription has been provided.  Subjective and objective measures of pulmonary function will be followed and the treatment plan will be  adjusted accordingly.  Allergic rhinitis  Continue appropriate aeroallergen avoidance measures.  Continue azelastine nasal spray, 1-2 sprays per nostril 2 times daily as needed, plus/minus fluticasone nasal spray if needed.  Nasal saline lavage (i.e., NeilMed) is recommended as needed and prior to medicated nasal sprays.  For thick postnasal drainage, add guaifenesin [id number] mg (Mucinex in the blue box)  twice daily as needed with adequate hydration as discussed.  Seasonal allergic conjunctivitis  Treatment plan as outlined above for allergic rhinitis.  Pataday or Zaditor as needed.  Eye lubricant drops (Natural Tears) as needed.  GERD (gastroesophageal reflux disease)  Appropriate reflux lifestyle modifications have been provided in written form.  Continue omeprazole as prescribed.  For now, continue famotidine (Pepcid) 20 mg twice daily.   Diagnostics: Spirometry reveals an FVC of 1.62 L (72% predicted) and an FEV1 of 0.97 L (54% predicted) without significant postbronchodilator improvement.  Her FEV1 today is improved compared with her previous study.  Please see scanned spirometry results for details.    Physical examination: Blood pressure 110/72, pulse 75, temperature 97.6 F (36.4 C), temperature source Oral, resp. rate 16, SpO2 96 %.  General: Alert, interactive, in no acute distress. HEENT: TMs pearly gray, turbinates mildly edematous without discharge, post-pharynx unremarkable. Neck: Supple without lymphadenopathy. Lungs: Clear to auscultation without wheezing, rhonchi or rales. CV: Normal S1, S2 without murmurs. Skin: Warm and dry, without lesions or rashes.  The following portions of the patient's history were reviewed and updated as appropriate: allergies, current medications, past family history, past medical history, past social history, past surgical history and problem list.   Current Outpatient Medications  Medication Sig Dispense Refill  .  Accu-Chek Softclix Lancets lancets TEST  TWICE A DAY    . albuterol (VENTOLIN HFA) 108 (90 Base) MCG/ACT inhaler Inhale 2 puffs into the lungs every 6 (six) hours as needed for wheezing or shortness of breath.    Marland Kitchen aspirin 81 MG tablet Take 325 mg by mouth daily.     Marland Kitchen atorvastatin (LIPITOR) 80 MG tablet Take by mouth.    Marland Kitchen BIOTIN PO Take by mouth.    . budesonide-formoterol (SYMBICORT) 160-4.5 MCG/ACT inhaler Inhale 2 puffs into the lungs 2 (two) times daily. 1 Inhaler 5  . Calcium Carb-Cholecalciferol (OYSTER SHELL CALCIUM) 500-400 MG-UNIT TABS Take 1 tablet by mouth daily.    . Calcium-Vitamin D-Vitamin K (VIACTIV) 500-100-40 MG-UNT-MCG CHEW Chew 2 tablets by mouth daily.    . clopidogrel (PLAVIX) 75 MG tablet Take 75 mg by mouth daily.    . fluticasone (FLONASE) 50 MCG/ACT nasal spray Place 2 sprays into both nostrils daily. 18.2 mL 5  . fluticasone (FLOVENT HFA) 110 MCG/ACT inhaler Inhale 2 puffs into the lungs 2 (two) times daily. During asthma flares. 1 Inhaler 5  . gabapentin (NEURONTIN) 300 MG capsule Take 300 mg by mouth 3 (three) times daily.    Marland Kitchen glipiZIDE (GLUCOTROL XL) 5 MG 24 hr tablet     . glucose blood (ACCU-CHEK AVIVA PLUS) test strip USE TO TEST BLOOD SUGAR TWICE DAILY    . IRON CR PO Take by mouth.    . montelukast (SINGULAIR) 10 MG tablet Take 1 tablet (10 mg total) by mouth at bedtime. 30 tablet 5  . Olopatadine HCl (PATADAY) 0.2 % SOLN Place 1 drop into both eyes daily as needed. 2.5 mL 5  . omeprazole (PRILOSEC) 20 MG capsule Take 20 mg by mouth daily.     Bertram Gala Glycol-Propyl Glycol (SYSTANE OP) Place 1 drop into both eyes daily as needed.    . potassium chloride (K-DUR,KLOR-CON) 10 MEQ tablet Take 10 mEq by mouth daily.    Marland Kitchen pyridoxine (B-6) 100 MG tablet Take by mouth. B12    . sertraline (ZOLOFT) 25 MG tablet Take 25 mg by mouth daily.    . Tiotropium Bromide Monohydrate (SPIRIVA RESPIMAT) 1.25 MCG/ACT AERS Inhale 2 puffs into the lungs daily. 12 g 3  .  valsartan-hydrochlorothiazide (DIOVAN-HCT) 160-25 MG tablet     . vitamin C (ASCORBIC ACID) 500 MG tablet Take 500 mg by mouth daily.    Marland Kitchen albuterol (PROVENTIL) (2.5 MG/3ML) 0.083% nebulizer solution Inhale into the lungs.     Current Facility-Administered Medications  Medication Dose Route Frequency Provider Last Rate Last Admin  . predniSONE (DELTASONE) tablet 10 mg  10 mg Oral Q breakfast Marchetta Navratil, Heywood Iles, MD      . predniSONE (DELTASONE) tablet 10 mg  10 mg Oral Q breakfast Ansh Fauble, Heywood Iles, MD        Allergies  Allergen Reactions  . Aleve [Naproxen]     Effects blood pressure  . Lactose Intolerance (Gi) Diarrhea  . Other     All Narcotics cause itching and rash.  Marland Kitchen Percocet [Oxycodone-Acetaminophen] Itching  . Vicodin [Hydrocodone-Acetaminophen] Itching   Review of systems: Review of systems negative except as noted in HPI / PMHx.  Past Medical History:  Diagnosis Date  . Arthritis   . Asthma   . Diabetes mellitus   . Hypertension     Family History  Problem Relation Age of Onset  . Allergic rhinitis Neg Hx   . Asthma Neg Hx   . Eczema Neg Hx   . Urticaria  Neg Hx     Social History   Socioeconomic History  . Marital status: Widowed    Spouse name: Not on file  . Number of children: Not on file  . Years of education: Not on file  . Highest education level: Not on file  Occupational History  . Not on file  Tobacco Use  . Smoking status: Former Smoker    Packs/day: 1.00    Years: 20.00    Pack years: 20.00    Types: Cigarettes    Quit date: 07/19/1992    Years since quitting: 27.5  . Smokeless tobacco: Never Used  Vaping Use  . Vaping Use: Never used  Substance and Sexual Activity  . Alcohol use: No    Comment: rarely  . Drug use: No  . Sexual activity: Yes    Birth control/protection: Surgical  Other Topics Concern  . Not on file  Social History Narrative  . Not on file   Social Determinants of Health   Financial Resource Strain:     . Difficulty of Paying Living Expenses:   Food Insecurity:   . Worried About Programme researcher, broadcasting/film/video in the Last Year:   . Barista in the Last Year:   Transportation Needs:   . Freight forwarder (Medical):   Marland Kitchen Lack of Transportation (Non-Medical):   Physical Activity:   . Days of Exercise per Week:   . Minutes of Exercise per Session:   Stress:   . Feeling of Stress :   Social Connections:   . Frequency of Communication with Friends and Family:   . Frequency of Social Gatherings with Friends and Family:   . Attends Religious Services:   . Active Member of Clubs or Organizations:   . Attends Banker Meetings:   Marland Kitchen Marital Status:   Intimate Partner Violence:   . Fear of Current or Ex-Partner:   . Emotionally Abused:   Marland Kitchen Physically Abused:   . Sexually Abused:     I appreciate the opportunity to take part in Ruthene's care. Please do not hesitate to contact me with questions.  Sincerely,   R. Jorene Guest, MD

## 2020-01-20 NOTE — Assessment & Plan Note (Signed)
   Insurance paperwork has been submitted for [employer].  She will start Fasenra injections once the approval process is completed.  For now, continue Symbicort 160-4.5 g to 2 inhalations via spacer device twice a day rather than once a day.  Continue Spiriva 2 inhalations daily and montelukast 10 mg daily at bedtime.  Continue albuterol every 4-6 hours if needed.  During respiratory tract infections or asthma flares, add Flovent 110g 2 inhalations via spacer device 2 times per day until symptoms have returned to baseline.  A prescription has been provided.  Subjective and objective measures of pulmonary function will be followed and the treatment plan will be adjusted accordingly.

## 2020-02-22 ENCOUNTER — Other Ambulatory Visit: Payer: Self-pay

## 2020-02-22 ENCOUNTER — Encounter (HOSPITAL_BASED_OUTPATIENT_CLINIC_OR_DEPARTMENT_OTHER): Payer: Self-pay | Admitting: *Deleted

## 2020-02-22 DIAGNOSIS — E1139 Type 2 diabetes mellitus with other diabetic ophthalmic complication: Secondary | ICD-10-CM | POA: Diagnosis not present

## 2020-02-22 DIAGNOSIS — Z7951 Long term (current) use of inhaled steroids: Secondary | ICD-10-CM | POA: Diagnosis not present

## 2020-02-22 DIAGNOSIS — Z7982 Long term (current) use of aspirin: Secondary | ICD-10-CM | POA: Insufficient documentation

## 2020-02-22 DIAGNOSIS — I10 Essential (primary) hypertension: Secondary | ICD-10-CM | POA: Diagnosis not present

## 2020-02-22 DIAGNOSIS — Z96653 Presence of artificial knee joint, bilateral: Secondary | ICD-10-CM | POA: Insufficient documentation

## 2020-02-22 DIAGNOSIS — Z79899 Other long term (current) drug therapy: Secondary | ICD-10-CM | POA: Diagnosis not present

## 2020-02-22 DIAGNOSIS — R040 Epistaxis: Secondary | ICD-10-CM | POA: Insufficient documentation

## 2020-02-22 DIAGNOSIS — J45909 Unspecified asthma, uncomplicated: Secondary | ICD-10-CM | POA: Diagnosis not present

## 2020-02-22 DIAGNOSIS — Z87891 Personal history of nicotine dependence: Secondary | ICD-10-CM | POA: Diagnosis not present

## 2020-02-22 DIAGNOSIS — E1149 Type 2 diabetes mellitus with other diabetic neurological complication: Secondary | ICD-10-CM | POA: Diagnosis not present

## 2020-02-22 NOTE — ED Triage Notes (Signed)
Nosebleed x 2 hours ago. Pressure is controlling the bleeding on arrival to the ED.

## 2020-02-23 ENCOUNTER — Emergency Department (HOSPITAL_BASED_OUTPATIENT_CLINIC_OR_DEPARTMENT_OTHER)
Admission: EM | Admit: 2020-02-23 | Discharge: 2020-02-23 | Disposition: A | Discharge status: Home / Self Care | Payer: Medicare PPO | Attending: Emergency Medicine | Admitting: Emergency Medicine

## 2020-02-23 DIAGNOSIS — R040 Epistaxis: Secondary | ICD-10-CM

## 2020-02-23 MED ORDER — OXYMETAZOLINE HCL 0.05 % NA SOLN
NASAL | Status: AC
  Filled 2020-02-23: qty 30

## 2020-02-23 NOTE — ED Provider Notes (Signed)
[facility] EMERGENCY DEPARTMENT Provider Note   CSN: [id number] Arrival date & time: 02/22/20  1926     History Chief Complaint  Patient presents with  . Epistaxis    Delesa Kawa is a 74 y.o. female.  HPI     This is a 74 year old female with history of asthma, diabetes, hypertension who presents with nosebleed.  Patient is currently on Plavix.  She also reports recent sinus congestion.  She is taking Flonase.  She states that her air conditioner broke several days ago and she has been using fans in her house.  Last night she noted clot and blood from the right naris.  No known history of nosebleeds.  She did not do anything for her symptoms but noted the bleeding continued.  Past Medical History:  Diagnosis Date  . Arthritis   . Asthma   . Diabetes mellitus   . Hypertension     Patient Active Problem List   Diagnosis Date Noted  . Cough, persistent 01/03/2020  . Seasonal allergic conjunctivitis 06/17/2019  . Moderate persistent asthma 12/30/2018  . Fall 12/09/2018  . Healthcare maintenance 07/17/2018  . 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 Generalized anxiety disorder 08/28/2016  . Osteopenia 07/29/2016  . Onychomycosis due to dermatophyte 04/15/2016  . Pes anserinus bursitis of left knee 10/18/2015  . 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 Severe peripheral arterial disease (HCC) 07/17/2015  . Hammer toe of right foot 04/20/2015  . Vertigo 12/27/2014  . Left knee pain 12/20/2014  . Bilateral carotid artery stenosis 12/12/2014  . Left carotid bruit 11/25/2014  . Cataracts, bilateral 10/19/2014  . Glaucoma 10/19/2014  . Left atrial dilatation 10/10/2014  . Left ventricular hypertrophy 10/10/2014  . 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 Pulmonary hypertension (HCC) 10/10/2014  . Abdominal bruit 10/03/2014  . Hypokalemia 03/28/2014  . 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 Calcaneal spur 10/15/2013  . Pain in soft  tissues of limb 10/15/2013  . Essential hypertension 09/09/2013  . Insomnia 09/09/2013  . Psychophysiologic disorder 09/09/2013  . Asthma 08/17/2013  . Edema 08/17/2013  . Hypercholesterolemia 08/17/2013  . Iron deficiency anemia 08/17/2013  . Normocytic normochromic anemia 08/17/2013  . Obstructive sleep apnea (adult) (pediatric) 08/17/2013  . Peripheral edema 08/17/2013  . 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 Vitamin D deficiency 08/17/2013  . Allergic rhinitis 03/31/2013  . Anxiety state 03/31/2013  . Obesity 11/21/2012  . Background diabetic retinopathy (HCC) 11/21/2012  . Diaphragmatic hernia 11/21/2012  . Disorder of bone and cartilage 11/21/2012  . GERD (gastroesophageal reflux disease) 11/21/2012  . Motion sickness 11/21/2012  . Pain in joint involving ankle and foot 11/21/2012  . Shortness of breath 11/21/2012    Past Surgical History:  Procedure Laterality Date  . JOINT REPLACEMENT Bilateral    Knee replacements  . TOTAL KNEE ARTHROPLASTY    . TUBAL LIGATION       OB History   No obstetric history on file.     Family History  Problem Relation Age of Onset  . Allergic rhinitis Neg Hx   . Asthma Neg Hx   . Eczema Neg Hx   . Urticaria Neg Hx     Social History   Tobacco Use  . Smoking status: Former Smoker    Packs/day: 1.00    Years: 20.00    Pack years: 20.00    Types: Cigarettes  Quit date: 07/19/1992    Years since quitting: 27.6  . Smokeless tobacco: Never Used  Vaping Use  . Vaping Use: Never used  Substance Use Topics  . Alcohol use: No    Comment: rarely  . Drug use: No    Home Medications Prior to Admission medications   Medication Sig Start Date End Date Taking? Authorizing Provider  Accu-Chek Softclix Lancets lancets TEST TWICE A DAY 12/16/16   [provider]  albuterol (PROVENTIL) (2.5  MG/3ML) 0.083% nebulizer solution Inhale into the lungs. 12/26/16 12/11/19  [provider]  albuterol (VENTOLIN HFA) 108 (90 Base) MCG/ACT inhaler Inhale 2 puffs into the lungs every 6 (six) hours as needed for wheezing or shortness of breath.    [provider]  aspirin 81 MG tablet Take 325 mg by mouth daily.     [provider]  atorvastatin (LIPITOR) 80 MG tablet Take by mouth. 08/12/19   [provider]  BIOTIN PO Take by mouth.    [provider]  budesonide-formoterol (SYMBICORT) 160-4.5 MCG/ACT inhaler Inhale 2 puffs into the lungs 2 (two) times daily. 09/20/19   Bobbitt, Heywood Iles, MD  Calcium Carb-Cholecalciferol (OYSTER SHELL CALCIUM) 500-400 MG-UNIT TABS Take 1 tablet by mouth daily. 11/09/19   [provider]  Calcium-Vitamin D-Vitamin K (VIACTIV) 500-100-40 MG-UNT-MCG CHEW Chew 2 tablets by mouth daily.    [provider]  clopidogrel (PLAVIX) 75 MG tablet Take 75 mg by mouth daily.    [provider]  fluticasone (FLONASE) 50 MCG/ACT nasal spray Place 2 sprays into both nostrils daily. 11/09/19   Bobbitt, Heywood Iles, MD  fluticasone (FLOVENT HFA) 110 MCG/ACT inhaler Inhale 2 puffs into the lungs 2 (two) times daily. During asthma flares. 01/03/20   Bobbitt, Heywood Iles, MD  gabapentin (NEURONTIN) 300 MG capsule Take 300 mg by mouth 3 (three) times daily.    [provider]  glipiZIDE (GLUCOTROL XL) 5 MG 24 hr tablet  03/31/19   [provider]  glucose blood (ACCU-CHEK AVIVA PLUS) test strip USE TO TEST BLOOD SUGAR TWICE DAILY 12/05/16   [provider]  IRON CR PO Take by mouth.    [provider]  montelukast (SINGULAIR) 10 MG tablet Take 1 tablet (10 mg total) by mouth at bedtime. 12/30/18   Bobbitt, Heywood Iles, MD  Olopatadine HCl (PATADAY) 0.2 % SOLN Place 1 drop into both eyes daily as needed. 01/03/20   Bobbitt, Heywood Iles, MD  omeprazole (PRILOSEC) 20 MG capsule Take 20  mg by mouth daily.  05/06/19   [provider]  Polyethyl Glycol-Propyl Glycol (SYSTANE OP) Place 1 drop into both eyes daily as needed.    [provider]  potassium chloride (K-DUR,KLOR-CON) 10 MEQ tablet Take 10 mEq by mouth daily.    [provider]  pyridoxine (B-6) 100 MG tablet Take by mouth. B12    [provider]  sertraline (ZOLOFT) 25 MG tablet Take 25 mg by mouth daily.    [provider]  Tiotropium Bromide Monohydrate (SPIRIVA RESPIMAT) 1.25 MCG/ACT AERS Inhale 2 puffs into the lungs daily. 06/18/19   Hetty Blend, FNP  valsartan-hydrochlorothiazide (DIOVAN-HCT) 160-25 MG tablet  03/31/19   [provider]  vitamin C (ASCORBIC ACID) 500 MG tablet Take 500 mg by mouth daily.    [provider]    Allergies    Aleve [naproxen], Lactose intolerance (gi), Other, Percocet [oxycodone-acetaminophen], and Vicodin [hydrocodone-acetaminophen]  Review of Systems   Review of Systems  Constitutional: Negative for fever.  HENT: Positive for congestion, nosebleeds and sinus pain.   Respiratory: Negative for shortness of breath.   Cardiovascular: Negative for chest pain.  All other systems reviewed and are negative.   Physical Exam Updated Vital Signs BP (!) 155/76 (BP Location: Right Arm)   Pulse 71   Temp 98.2 F (36.8 C) (Tympanic)   Resp 19   Ht 1.626 m (5\' 4" )   Wt 100.6 kg   SpO2 100%   BMI 38.07 kg/m   Physical Exam Vitals and nursing note reviewed.  Constitutional:      Appearance: She is well-developed. She is obese. She is not ill-appearing.  HENT:     Head: Normocephalic and atraumatic.     Nose:     Comments: Large clot noted and evacuated from the right naris, there is some friable tissue over the nasal septum but no active bleeding    Mouth/Throat:     Mouth: Mucous membranes are moist.  Eyes:     Pupils: Pupils are equal, round, and reactive to light.  Cardiovascular:     Rate and Rhythm: Normal  rate and regular rhythm.     Heart sounds: Normal heart sounds.  Pulmonary:     Effort: Pulmonary effort is normal. No respiratory distress.  Musculoskeletal:     Cervical back: Neck supple.  Skin:    General: Skin is warm and dry.  Neurological:     Mental Status: She is alert and oriented to person, place, and time.  Psychiatric:        Mood and Affect: Mood normal.     ED Results / Procedures / Treatments   Labs (all labs ordered are listed, but only abnormal results are displayed) Labs Reviewed - No data to display  EKG None  Radiology No results found.  Procedures Procedures (including critical care time)  Medications Ordered in ED Medications  oxymetazoline (AFRIN) 0.05 % nasal spray (has no administration in time range)    ED Course  I have reviewed the triage vital signs and the nursing notes.  Pertinent labs & imaging results that were available during my care of the patient were reviewed by me and considered in my medical decision making (see chart for details).    MDM Rules/Calculators/A&P                           Patient presents with epistaxis.  Patient blew out a large clot from the right naris.  Tooth sprays of Afrin were instilled in the right naris.  Friable tissue over the nasal septum but no active bleeding noted.  Patient was observed without recurrent bleed.  Recommend humidifier and nasal saline.  She was given Afrin and instructions if she has a rebleed.  After history, exam, and medical workup I feel the patient has been appropriately medically screened and is safe for discharge home. Pertinent diagnoses were discussed with the patient. Patient was given return precautions.   Final Clinical Impression(s) / ED Diagnoses Final diagnoses:  Epistaxis    Rx / DC Orders ED Discharge Orders    None       Haylen Bellotti, , MD 02/23/20 [phone]

## 2020-04-14 ENCOUNTER — Telehealth: Payer: Self-pay | Admitting: *Deleted

## 2020-04-14 NOTE — Telephone Encounter (Signed)
Pt called and stated she was having an asthma exacerbation. I reviewed with her what medications she should be using. She will add the Symbicort and Flovent into her regimen, she is currently only using the Spiriva- if her breathing gets worse over the weekend she will go to urgent care and if her breathing has not improved by Monday she will call us back. I offered to schedule her for a work-in appt @ 3 but she declined.

## 2020-04-29 ENCOUNTER — Other Ambulatory Visit: Payer: Self-pay | Admitting: Family Medicine

## 2020-06-03 ENCOUNTER — Other Ambulatory Visit: Payer: Self-pay | Admitting: Allergy and Immunology

## 2020-08-21 ENCOUNTER — Ambulatory Visit (INDEPENDENT_AMBULATORY_CARE_PROVIDER_SITE_OTHER): Payer: Medicare Other | Admitting: Family Medicine

## 2020-08-21 ENCOUNTER — Other Ambulatory Visit: Payer: Self-pay

## 2020-08-21 ENCOUNTER — Encounter: Payer: Self-pay | Admitting: Family Medicine

## 2020-08-21 VITALS — BP 92/60 | HR 82 | Temp 98.0°F | Resp 14

## 2020-08-21 DIAGNOSIS — R053 Chronic cough: Secondary | ICD-10-CM

## 2020-08-21 DIAGNOSIS — J4541 Moderate persistent asthma with (acute) exacerbation: Secondary | ICD-10-CM

## 2020-08-21 DIAGNOSIS — H1013 Acute atopic conjunctivitis, bilateral: Secondary | ICD-10-CM | POA: Diagnosis not present

## 2020-08-21 DIAGNOSIS — Z9189 Other specified personal risk factors, not elsewhere classified: Secondary | ICD-10-CM

## 2020-08-21 DIAGNOSIS — J4540 Moderate persistent asthma, uncomplicated: Secondary | ICD-10-CM | POA: Diagnosis not present

## 2020-08-21 DIAGNOSIS — K219 Gastro-esophageal reflux disease without esophagitis: Secondary | ICD-10-CM

## 2020-08-21 DIAGNOSIS — J3089 Other allergic rhinitis: Secondary | ICD-10-CM

## 2020-08-21 DIAGNOSIS — E1149 Type 2 diabetes mellitus with other diabetic neurological complication: Secondary | ICD-10-CM

## 2020-08-21 DIAGNOSIS — H1010 Acute atopic conjunctivitis, unspecified eye: Secondary | ICD-10-CM

## 2020-08-21 NOTE — Progress Notes (Signed)
[address] [place] [address] Dept: [phone]  FOLLOW UP NOTE  Patient ID: Tina [name], female    DOB: 1945/11/07  Age: 75 y.o. MRN: [id number] Date of Office Visit: 08/21/2020  Assessment  Chief Complaint: Asthma (Coughing since Wednesday, gotten worse Friday and Saturday, started coughing up yellowish thick mucus. Mucinex didn't help.)  HPI Tina [name] is a 75 year old female who presents to the clinic for follow-up visit and congestion.  She was last seen in this clinic on 01/20/2020 by Dr. Nunzio Cobbs for evaluation of asthma, allergic rhinitis, allergic conjunctivitis, and reflux.  At today's visit, she reports that on Wednesday she began to experience a cough producing thick yellow to gray sputum as well as shortness of breath, cough, and wheeze.  She reports the cough worsened and the phlegm thickened over the weekend.  She continues Symbicort 160-2 puffs twice a day and has used albuterol via nebulizer one time since Wednesday.  She is not currently using Flovent 110 for asthma flare.  Allergic rhinitis is reported as moderately well controlled with clear rhinorrhea occurring occasionally.  She continues nasal saline as well as azelastine as needed.  She is not currently using Mucinex.  She denies fever and sick contacts, however, she does work in a daycare center.  She has received 2 Covid vaccinations as well as a booster Covid vaccination.  Allergic conjunctivitis is reported as moderately well controlled with Pataday as needed.  Reflux is reported as well controlled with omeprazole daily.  Her current medications are listed in the chart.   Drug Allergies:  Allergies  Allergen Reactions  . Aleve [Naproxen]     Effects blood pressure  . Lactose Intolerance (Gi) Diarrhea  . Other     All Narcotics cause itching and rash.  Marland Kitchen Percocet [Oxycodone-Acetaminophen] Itching  . Vicodin [Hydrocodone-Acetaminophen] Itching    Physical Exam: BP 92/60 (BP Location: Left  Arm, Patient Position: Sitting, Cuff Size: Large)   Pulse 82   Temp 98 F (36.7 C) (Temporal)   Resp 14   SpO2 98%    Physical Exam Vitals reviewed.  Constitutional:      Appearance: Normal appearance.  HENT:     Head: Normocephalic and atraumatic.     Right Ear: Tympanic membrane normal.     Left Ear: Tympanic membrane normal.     Nose:     Comments: Bilateral nares edematous and pale with thick clear nasal drainage noted.  Pharynx is slightly erythematous with no exudate.  Ears normal.  Eyes normal.    Mouth/Throat:     Pharynx: Oropharynx is clear.  Eyes:     Conjunctiva/sclera: Conjunctivae normal.  Cardiovascular:     Rate and Rhythm: Normal rate and regular rhythm.     Heart sounds: Normal heart sounds. No murmur heard.   Pulmonary:     Effort: Pulmonary effort is normal.     Comments: Bilateral expiratory wheeze with moderate improvement postbronchodilator therapy. Musculoskeletal:        General: Normal range of motion.     Cervical back: Normal range of motion and neck supple.  Skin:    General: Skin is warm and dry.  Neurological:     Mental Status: She is alert and oriented to person, place, and time.  Psychiatric:        Mood and Affect: Mood normal.        Behavior: Behavior normal.        Thought Content: Thought content normal.  Judgment: Judgment normal.     Diagnostics: FVC 1.41, FEV1 0.79.  Predicted FVC 2.32, predicted FEV1 1.80.  Spirometry indicates moderate restriction and moderate airway obstruction.  Assessment and Plan: 1. Moderate persistent asthma with acute exacerbation   2. Perennial allergic rhinitis   3. Seasonal allergic conjunctivitis   4. Cough, persistent   5. Gastroesophageal reflux disease, unspecified whether esophagitis present   6. 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 At increased risk of exposure to COVID-19 virus     Meds ordered this encounter  Medications  .  albuterol (PROVENTIL) (2.5 MG/3ML) 0.083% nebulizer solution    Sig: Inhale 3 mLs (2.5 mg total) into the lungs every 4 (four) hours as needed for wheezing or shortness of breath.    Dispense:  75 mL    Refill:  1  . albuterol (VENTOLIN HFA) 108 (90 Base) MCG/ACT inhaler    Sig: Inhale 2 puffs into the lungs every 4 (four) hours as needed for wheezing or shortness of breath.    Dispense:  1 each    Refill:  2  . montelukast (SINGULAIR) 10 MG tablet    Sig: Take 1 tablet (10 mg total) by mouth at bedtime.    Dispense:  30 tablet    Refill:  5  . Tiotropium Bromide Monohydrate (SPIRIVA RESPIMAT) 1.25 MCG/ACT AERS    Sig: INHALE 2 PUFFS BY MOUTH EVERY DAY    Dispense:  4 g    Refill:  1    Patient Instructions  Cough An order for chest xray has been ordered at [facility] location We will call you with the result as soon as it becomes available  Asthma Begin prednisone 10 mg tablets. Take 2 tablets twice a day for 3 days, then take 2 tablets once a day for 1 day, then take 1 tablet on the 5th day, then stop Begin Spiriva 1.25 mcg 2 puffs once a day to prevent cough or wheeze (Spirive 2.5 sample given - take 1 puff once a day of the sample) Continue montelukast 10 mg once a day to prevent cough or wheeze Continue Symbicort 160-2 puffs twice a day with a spacer to prevent cough or wheeze Continue albuterol 2 puffs every 4 hours as needed for cough or wheeze OR Instead use albuterol 0.083% solution via nebulizer one unit vial every 4 hours as needed for cough or wheeze You may use albuterol 2 puffs 5 to 15 minutes before activity to decrease cough or wheeze Lets move forward with Fasenra injections. Have access to an epinephrine autoinjector set  Allergic rhinitis Continue allergen avoidance measures directed toward dust mites as listed below Continue Flonase 2 sprays in each nostril once a day as needed for stuffy nose.  In the right nostril, point the applicator out toward the  right ear. In the left nostril, point the applicator out toward the left ear Consider saline nasal rinses as needed for nasal symptoms. Use this before any medicated nasal sprays for best result For thick postnasal drainage, begin Mucinex 600 to 1200 mg twice a day and increase fluid intake in order to thin mucus  Allergic conjunctivitis Some over the counter eye drops include Pataday one drop in each eye once a day as needed for red, itchy eyes OR Zaditor one drop in each eye twice a day as needed for red itchy eyes.  Reflux Continue dietary and lifestyle modifications as listed below Continue famotidine and omeprazole as  previously prescribed  COVID Negative test today  Call the clinic if this treatment plan is not working well for you  Follow up in 1 week or sooner if needed.   Control of Dust Mite Allergen Dust mites play a major role in allergic asthma and rhinitis. They occur in environments with high humidity wherever human skin is found. Dust mites absorb humidity from the atmosphere (ie, they do not drink) and feed on organic matter (including shed human and animal skin). Dust mites are a microscopic type of insect that you cannot see with the naked eye. High levels of dust mites have been detected from mattresses, pillows, carpets, upholstered furniture, bed covers, clothes, soft toys and any woven material. The principal allergen of the dust mite is found in its feces. A gram of dust may contain 1,000 mites and 250,000 fecal particles. Mite antigen is easily measured in the air during house cleaning activities. Dust mites do not bite and do not cause harm to humans, other than by triggering allergies/asthma.  Ways to decrease your exposure to dust mites in your home:  1. Encase mattresses, box springs and pillows with a mite-impermeable barrier or cover  2. Wash sheets, blankets and drapes weekly in hot water (130 F) with detergent and dry them in a dryer on the hot  setting.  3. Have the room cleaned frequently with a vacuum cleaner and a damp dust-mop. For carpeting or rugs, vacuuming with a vacuum cleaner equipped with a high-efficiency particulate air (HEPA) filter. The dust mite allergic individual should not be in a room which is being cleaned and should wait 1 hour after cleaning before going into the room.  4. Do not sleep on upholstered furniture (eg, couches).  5. If possible removing carpeting, upholstered furniture and drapery from the home is ideal. Horizontal blinds should be eliminated in the rooms where the person spends the most time (bedroom, study, television room). Washable vinyl, roller-type shades are optimal.  6. Remove all non-washable stuffed toys from the bedroom. Wash stuffed toys weekly like sheets and blankets above.  7. Reduce indoor humidity to less than 50%. Inexpensive humidity monitors can be purchased at most hardware stores. Do not use a humidifier as can make the problem worse and are not recommended.   Lifestyle Changes for Controlling GERD When you have GERD, stomach acid feels as if it's backing up toward your mouth. Whether or not you take medication to control your GERD, your symptoms can often be improved with lifestyle changes.   Raise Your Head  Reflux is more likely to strike when you're lying down flat, because stomach fluid can  flow backward more easily. Raising the head of your bed 4-6 inches can help. To do this:  Slide blocks or books under the legs at the head of your bed. Or, place a wedge under  the mattress. Many foam stores can make a suitable wedge for you. The wedge  should run from your waist to the top of your head.  Don't just prop your head on several pillows. This increases pressure on your  stomach. It can make GERD worse.  Watch Your Eating Habits Certain foods may increase the acid in your stomach or relax the lower esophageal sphincter, making GERD more likely. It's best to  avoid the following:  Coffee, tea, and carbonated drinks (with and without caffeine)  Fatty, fried, or spicy food  Mint, chocolate, onions, and tomatoes  Any other foods that seem to irritate your stomach  or cause you pain  Relieve the Pressure  Eat smaller meals, even if you have to eat more often.  Don't lie down right after you eat. Wait a few hours for your stomach to empty.  Avoid tight belts and tight-fitting clothes.  Lose excess weight.  Tobacco and Alcohol  Avoid smoking tobacco and drinking alcohol. They can make GERD symptoms worse.    Return in about 1 week (around 08/28/2020), or if symptoms worsen or fail to improve.    Thank you for the opportunity to care for this patient.  Please do not hesitate to contact me with questions.  Thermon LeylandAnne Nazia Rhines, FNP [facility]

## 2020-08-21 NOTE — Patient Instructions (Addendum)
Cough An order for chest xray has been ordered at [facility] location We will call you with the result as soon as it becomes available  Asthma Begin prednisone 10 mg tablets. Take 2 tablets twice a day for 3 days, then take 2 tablets once a day for 1 day, then take 1 tablet on the 5th day, then stop Begin Spiriva 1.25 mcg 2 puffs once a day to prevent cough or wheeze (Spirive 2.5 sample given - take 1 puff once a day of the sample) Continue montelukast 10 mg once a day to prevent cough or wheeze Continue Symbicort 160-2 puffs twice a day with a spacer to prevent cough or wheeze Continue albuterol 2 puffs every 4 hours as needed for cough or wheeze OR Instead use albuterol 0.083% solution via nebulizer one unit vial every 4 hours as needed for cough or wheeze You may use albuterol 2 puffs 5 to 15 minutes before activity to decrease cough or wheeze Lets move forward with Fasenra injections. Have access to an epinephrine autoinjector set  Allergic rhinitis Continue allergen avoidance measures directed toward dust mites as listed below Continue Flonase 2 sprays in each nostril once a day as needed for stuffy nose.  In the right nostril, point the applicator out toward the right ear. In the left nostril, point the applicator out toward the left ear Consider saline nasal rinses as needed for nasal symptoms. Use this before any medicated nasal sprays for best result For thick postnasal drainage, begin Mucinex 600 to 1200 mg twice a day and increase fluid intake in order to thin mucus  Allergic conjunctivitis Some over the counter eye drops include Pataday one drop in each eye once a day as needed for red, itchy eyes OR Zaditor one drop in each eye twice a day as needed for red itchy eyes.  Reflux Continue dietary and lifestyle modifications as listed below Continue famotidine and omeprazole as previously prescribed  COVID Negative test today  Call the clinic if this treatment plan is  not working well for you  Follow up in 1 week or sooner if needed.   Control of Dust Mite Allergen Dust mites play a major role in allergic asthma and rhinitis. They occur in environments with high humidity wherever human skin is found. Dust mites absorb humidity from the atmosphere (ie, they do not drink) and feed on organic matter (including shed human and animal skin). Dust mites are a microscopic type of insect that you cannot see with the naked eye. High levels of dust mites have been detected from mattresses, pillows, carpets, upholstered furniture, bed covers, clothes, soft toys and any woven material. The principal allergen of the dust mite is found in its feces. A gram of dust may contain 1,000 mites and 250,000 fecal particles. Mite antigen is easily measured in the air during house cleaning activities. Dust mites do not bite and do not cause harm to humans, other than by triggering allergies/asthma.  Ways to decrease your exposure to dust mites in your home:  1. Encase mattresses, box springs and pillows with a mite-impermeable barrier or cover  2. Wash sheets, blankets and drapes weekly in hot water (130 F) with detergent and dry them in a dryer on the hot setting.  3. Have the room cleaned frequently with a vacuum cleaner and a damp dust-mop. For carpeting or rugs, vacuuming with a vacuum cleaner equipped with a high-efficiency particulate air (HEPA) filter. The dust mite allergic individual should not be  in a room which is being cleaned and should wait 1 hour after cleaning before going into the room.  4. Do not sleep on upholstered furniture (eg, couches).  5. If possible removing carpeting, upholstered furniture and drapery from the home is ideal. Horizontal blinds should be eliminated in the rooms where the person spends the most time (bedroom, study, television room). Washable vinyl, roller-type shades are optimal.  6. Remove all non-washable stuffed toys from the bedroom. Wash  stuffed toys weekly like sheets and blankets above.  7. Reduce indoor humidity to less than 50%. Inexpensive humidity monitors can be purchased at most hardware stores. Do not use a humidifier as can make the problem worse and are not recommended.   Lifestyle Changes for Controlling GERD When you have GERD, stomach acid feels as if it's backing up toward your mouth. Whether or not you take medication to control your GERD, your symptoms can often be improved with lifestyle changes.   Raise Your Head  Reflux is more likely to strike when you're lying down flat, because stomach fluid can  flow backward more easily. Raising the head of your bed 4-6 inches can help. To do this:  Slide blocks or books under the legs at the head of your bed. Or, place a wedge under  the mattress. Many foam stores can make a suitable wedge for you. The wedge  should run from your waist to the top of your head.  Don't just prop your head on several pillows. This increases pressure on your  stomach. It can make GERD worse.  Watch Your Eating Habits Certain foods may increase the acid in your stomach or relax the lower esophageal sphincter, making GERD more likely. It's best to avoid the following:  Coffee, tea, and carbonated drinks (with and without caffeine)  Fatty, fried, or spicy food  Mint, chocolate, onions, and tomatoes  Any other foods that seem to irritate your stomach or cause you pain  Relieve the Pressure  Eat smaller meals, even if you have to eat more often.  Don't lie down right after you eat. Wait a few hours for your stomach to empty.  Avoid tight belts and tight-fitting clothes.  Lose excess weight.  Tobacco and Alcohol  Avoid smoking tobacco and drinking alcohol. They can make GERD symptoms worse.

## 2020-08-22 ENCOUNTER — Ambulatory Visit (HOSPITAL_BASED_OUTPATIENT_CLINIC_OR_DEPARTMENT_OTHER)
Admission: RE | Admit: 2020-08-22 | Discharge: 2020-08-22 | Disposition: A | Discharge status: Home / Self Care | Payer: Medicare Other | Source: Ambulatory Visit | Attending: Family Medicine | Admitting: Family Medicine

## 2020-08-22 ENCOUNTER — Other Ambulatory Visit: Payer: Self-pay | Admitting: Family Medicine

## 2020-08-22 ENCOUNTER — Telehealth: Payer: Self-pay | Admitting: Family Medicine

## 2020-08-22 ENCOUNTER — Encounter: Payer: Self-pay | Admitting: Family Medicine

## 2020-08-22 ENCOUNTER — Telehealth: Payer: Self-pay | Admitting: *Deleted

## 2020-08-22 DIAGNOSIS — R053 Chronic cough: Secondary | ICD-10-CM | POA: Insufficient documentation

## 2020-08-22 DIAGNOSIS — Z9189 Other specified personal risk factors, not elsewhere classified: Secondary | ICD-10-CM | POA: Insufficient documentation

## 2020-08-22 MED ORDER — ALBUTEROL SULFATE HFA 108 (90 BASE) MCG/ACT IN AERS: 2.0000 | INHALATION_SPRAY | RESPIRATORY_TRACT | 2 refills | Status: DC | PRN

## 2020-08-22 MED ORDER — AZITHROMYCIN 250 MG PO TABS: ORAL_TABLET | ORAL | 0 refills | Status: DC

## 2020-08-22 MED ORDER — SPIRIVA RESPIMAT 1.25 MCG/ACT IN AERS: INHALATION_SPRAY | RESPIRATORY_TRACT | 1 refills | Status: DC

## 2020-08-22 MED ORDER — MONTELUKAST SODIUM 10 MG PO TABS: 10.0000 mg | ORAL_TABLET | Freq: Every day | ORAL | 5 refills | Status: DC

## 2020-08-22 MED ORDER — ALBUTEROL SULFATE (2.5 MG/3ML) 0.083% IN NEBU: 2.5000 mg | INHALATION_SOLUTION | RESPIRATORY_TRACT | 1 refills | Status: DC | PRN

## 2020-08-22 NOTE — Telephone Encounter (Signed)
Spoke to patient and she will come to [facility] office to sign consent and bring income verification with her

## 2020-08-22 NOTE — Telephone Encounter (Signed)
L/m for patient to return call to advise will need to send new consent and get current income to resubmit since her free drug thru patient assistance ended 06/16/20

## 2020-08-22 NOTE — Progress Notes (Signed)
Patient notified of chest xray results. We will refer her to a pulmonary specialist for further evaluation.

## 2020-08-22 NOTE — Telephone Encounter (Signed)
-----   Message from Hetty Blend, FNP sent at 08/22/2020  8:34 AM EST ----- Hi Marieanne Marxen, I had this patient in the clinic in another asthma exacerbation yesterday. She said she received a letter of approval to begin Harrington Challenger back in about August and never followed up on it. She is wanting to move forward with beginning Harrington Challenger now. Can you please let her know what steps to take to get this going? Thank you!!

## 2020-08-22 NOTE — Telephone Encounter (Signed)
Patient notified of chest xray results. She reports that she has not previously had an evaluation by a pulmonary specialist. She reports that her breathing has improved significantly since beginning prednisone last night. She understands that she does not currently have a lung infection, however, is interested in trying azithromycin for antiinflammatory properties. She will call with any further questions.

## 2020-08-25 ENCOUNTER — Encounter: Payer: Self-pay | Admitting: *Deleted

## 2020-08-31 ENCOUNTER — Telehealth: Payer: Self-pay

## 2020-08-31 NOTE — Telephone Encounter (Signed)
Pt. Wanting to know if her pulmonology appointment  has been scheduled. Told pt.. I will look into tomorrow.

## 2020-08-31 NOTE — Telephone Encounter (Signed)
Please refer to pulmonology for evaluation of chronic or recurrent interstitial lung disease.

## 2020-08-31 NOTE — Telephone Encounter (Signed)
Referral has been sent.

## 2020-09-01 NOTE — Telephone Encounter (Signed)
I called the Pulmonary department with [facility] & they only accept referrals via fax. I was unable to move forward with getting the patient scheduled. Referral has been faxed to their office for review.  I called & left a voicemail to confirm with the patient regarding this location being okay. I also could refer her to [facility] in [place] if need be.      Pulmonology - [facility] [employer] [address] [place], [place] [address]  P: [phone] Fax: [phone]  Will follow back up with the patient on Monday if she doesn't call back today.    PS: Sorry for the delay I have been out of the office.

## 2020-09-06 ENCOUNTER — Other Ambulatory Visit: Payer: Self-pay

## 2020-09-06 ENCOUNTER — Ambulatory Visit (INDEPENDENT_AMBULATORY_CARE_PROVIDER_SITE_OTHER): Payer: Medicare Other

## 2020-09-06 DIAGNOSIS — J4550 Severe persistent asthma, uncomplicated: Secondary | ICD-10-CM | POA: Diagnosis not present

## 2020-09-06 DIAGNOSIS — J4541 Moderate persistent asthma with (acute) exacerbation: Secondary | ICD-10-CM

## 2020-09-06 MED ORDER — EPINEPHRINE 0.3 MG/0.3ML IJ SOAJ: 0.3000 mg | Freq: Once | INTRAMUSCULAR | 1 refills | Status: AC

## 2020-09-06 MED ORDER — BENRALIZUMAB 30 MG/ML ~~LOC~~ SOSY
30.0000 mg | PREFILLED_SYRINGE | Freq: Once | SUBCUTANEOUS | Status: AC
  Administered 2020-09-06: 30 mg via SUBCUTANEOUS

## 2020-09-06 NOTE — Progress Notes (Signed)
Immunotherapy   Patient Details  Name: Tina [name] MRN: [id number] Date of Birth: [birth year]  09/06/2020  Tina [name] pt here to start fasenra Following schedule: na  Frequency:every 4 weeks for 3 rounds then q 8 weeks moving forward Epi-Pen:yes sent in Consent signed and patient instructions given.   Tina [name] 09/06/2020, 1:42 PM

## 2020-09-12 ENCOUNTER — Encounter: Payer: Self-pay | Admitting: Family Medicine

## 2020-09-12 ENCOUNTER — Ambulatory Visit: Payer: Medicare Other | Admitting: Family Medicine

## 2020-09-12 ENCOUNTER — Other Ambulatory Visit: Payer: Self-pay

## 2020-09-12 VITALS — BP 120/76 | HR 74 | Temp 98.1°F | Resp 20

## 2020-09-12 DIAGNOSIS — K219 Gastro-esophageal reflux disease without esophagitis: Secondary | ICD-10-CM

## 2020-09-12 DIAGNOSIS — H1013 Acute atopic conjunctivitis, bilateral: Secondary | ICD-10-CM | POA: Diagnosis not present

## 2020-09-12 DIAGNOSIS — J4540 Moderate persistent asthma, uncomplicated: Secondary | ICD-10-CM

## 2020-09-12 DIAGNOSIS — J3089 Other allergic rhinitis: Secondary | ICD-10-CM

## 2020-09-12 NOTE — Patient Instructions (Addendum)
Asthma Continue montelukast 10 mg once a day to prevent cough or wheeze Continue Symbicort 160-2 puffs twice a day with a spacer to prevent cough or wheeze Continue Spiriva 2.5 mcg take 1 puff once a day to prevent cough or wheeze Continue albuterol 2 puffs every 4 hours as needed for cough or wheeze OR Instead use albuterol 0.083% solution via nebulizer one unit vial every 4 hours as needed for cough or wheeze You may use albuterol 2 puffs 5 to 15 minutes before activity to decrease cough or wheeze Continue Fasenra injections. Have access to an epinephrine autoinjector set Keep your appointment with Dr. Su Monks  Allergic rhinitis Continue allergen avoidance measures directed toward dust mites as listed below Continue Flonase 2 sprays in each nostril once a day as needed for stuffy nose.  In the right nostril, point the applicator out toward the right ear. In the left nostril, point the applicator out toward the left ear Consider saline nasal rinses as needed for nasal symptoms. Use this before any medicated nasal sprays for best result For thick postnasal drainage, begin Mucinex 600 to 1200 mg twice a day and increase fluid intake in order to thin mucus  Allergic conjunctivitis Some over the counter eye drops include Pataday one drop in each eye once a day as needed for red, itchy eyes OR Zaditor one drop in each eye twice a day as needed for red itchy eyes.  Reflux Continue dietary and lifestyle modifications as listed below Continue famotidine and omeprazole as previously prescribed  Call the clinic if this treatment plan is not working well for you  Follow up in 3 months or sooner if needed.   Control of Dust Mite Allergen Dust mites play a major role in allergic asthma and rhinitis. They occur in environments with high humidity wherever human skin is found. Dust mites absorb humidity from the atmosphere (ie, they do not drink) and feed on organic matter (including shed human and animal  skin). Dust mites are a microscopic type of insect that you cannot see with the naked eye. High levels of dust mites have been detected from mattresses, pillows, carpets, upholstered furniture, bed covers, clothes, soft toys and any woven material. The principal allergen of the dust mite is found in its feces. A gram of dust may contain 1,000 mites and 250,000 fecal particles. Mite antigen is easily measured in the air during house cleaning activities. Dust mites do not bite and do not cause harm to humans, other than by triggering allergies/asthma.  Ways to decrease your exposure to dust mites in your home:  1. Encase mattresses, box springs and pillows with a mite-impermeable barrier or cover  2. Wash sheets, blankets and drapes weekly in hot water (130 F) with detergent and dry them in a dryer on the hot setting.  3. Have the room cleaned frequently with a vacuum cleaner and a damp dust-mop. For carpeting or rugs, vacuuming with a vacuum cleaner equipped with a high-efficiency particulate air (HEPA) filter. The dust mite allergic individual should not be in a room which is being cleaned and should wait 1 hour after cleaning before going into the room.  4. Do not sleep on upholstered furniture (eg, couches).  5. If possible removing carpeting, upholstered furniture and drapery from the home is ideal. Horizontal blinds should be eliminated in the rooms where the person spends the most time (bedroom, study, television room). Washable vinyl, roller-type shades are optimal.  6. Remove all non-washable stuffed toys from  the bedroom. Wash stuffed toys weekly like sheets and blankets above.  7. Reduce indoor humidity to less than 50%. Inexpensive humidity monitors can be purchased at most hardware stores. Do not use a humidifier as can make the problem worse and are not recommended.   Lifestyle Changes for Controlling GERD When you have GERD, stomach acid feels as if it's backing up toward your  mouth. Whether or not you take medication to control your GERD, your symptoms can often be improved with lifestyle changes.   Raise Your Head  Reflux is more likely to strike when you're lying down flat, because stomach fluid can  flow backward more easily. Raising the head of your bed 4-6 inches can help. To do this:  Slide blocks or books under the legs at the head of your bed. Or, place a wedge under  the mattress. Many foam stores can make a suitable wedge for you. The wedge  should run from your waist to the top of your head.  Don't just prop your head on several pillows. This increases pressure on your  stomach. It can make GERD worse.  Watch Your Eating Habits Certain foods may increase the acid in your stomach or relax the lower esophageal sphincter, making GERD more likely. It's best to avoid the following:  Coffee, tea, and carbonated drinks (with and without caffeine)  Fatty, fried, or spicy food  Mint, chocolate, onions, and tomatoes  Any other foods that seem to irritate your stomach or cause you pain  Relieve the Pressure  Eat smaller meals, even if you have to eat more often.  Don't lie down right after you eat. Wait a few hours for your stomach to empty.  Avoid tight belts and tight-fitting clothes.  Lose excess weight.  Tobacco and Alcohol  Avoid smoking tobacco and drinking alcohol. They can make GERD symptoms worse.

## 2020-09-12 NOTE — Progress Notes (Signed)
[address] [place] [address] Dept: [phone]  FOLLOW UP NOTE  Patient ID: Tina [name], female    DOB: 05-19-1946  Age: 75 y.o. MRN: [id number] Date of Office Visit: 09/12/2020  Assessment  Chief Complaint: Asthma  HPI Tina [name] a 75 year old female who presents the clinic for follow-up visit.  She was last seen in this clinic on 08/22/2019 for evaluation of asthma, allergic rhinitis, allergic conjunctivitis and reflux.  At her last visit to this clinic her chest x-ray indicated mild bilateral interstitial prominence.  Pneumonitis cannot be excluded. She was referred to pulmonology at that time. At today's visit, she reports her asthma has been much more controlled that at her last visit to this clinic.  She reports occasional shortness of breath with activity and no cough or wheeze with activity or rest.  She continues montelukast 10 mg once a day, Symbicort 160-2 puffs twice a day with a spacer, Spiriva 2.5 mcg 1 puff once a day and albuterol on a regular schedule twice a day. She received her first Fasenra injection on 09/06/2020 without adverse reaction.  Allergic rhinitis is reported as well controlled with Flonase and saline nasal rinses daily.  She is not taking Mucinex at this time.  Allergic conjunctivitis is reported as well controlled with Pataday, however, she ran out of this medication this morning.  Reflux is reported as well controlled with famotidine and omeprazole.  Her current medications are listed in the chart.   Drug Allergies:  Allergies  Allergen Reactions  . Aleve [Naproxen]     Effects blood pressure  . Lactose Intolerance (Gi) Diarrhea  . Other     All Narcotics cause itching and rash.  Marland Kitchen Percocet [Oxycodone-Acetaminophen] Itching  . Vicodin [Hydrocodone-Acetaminophen] Itching    Physical Exam: BP 120/76 (BP Location: Left Arm, Patient Position: Sitting, Cuff Size: Normal)   Pulse 74   Temp 98.1 F (36.7 C) (Tympanic)   Resp 20    SpO2 97%    Physical Exam Vitals reviewed.  Constitutional:      Appearance: Normal appearance.  HENT:     Head: Normocephalic and atraumatic.     Right Ear: Tympanic membrane normal.     Left Ear: Tympanic membrane normal.     Nose:     Comments: Bilateral nares slightly erythematous with clear nasal drainage noted.  Pharynx normal.  Ears normal.  Eyes normal.    Mouth/Throat:     Pharynx: Oropharynx is clear.  Eyes:     Conjunctiva/sclera: Conjunctivae normal.  Cardiovascular:     Rate and Rhythm: Normal rate and regular rhythm.     Heart sounds: Normal heart sounds. No murmur heard.   Pulmonary:     Effort: Pulmonary effort is normal.     Breath sounds: Normal breath sounds.     Comments: Lungs clear to auscultation Musculoskeletal:        General: Normal range of motion.     Cervical back: Normal range of motion and neck supple.  Skin:    General: Skin is warm and dry.  Neurological:     Mental Status: She is alert and oriented to person, place, and time.  Psychiatric:        Mood and Affect: Mood normal.        Behavior: Behavior normal.        Thought Content: Thought content normal.        Judgment: Judgment normal.     Diagnostics: FVC 1.15, FEV1 0.65.  Predicted FVC 2.32, predicted FEV1 1.80.  Spirometry indicates severe restriction and moderate airway obstruction.  Postbronchodilator FVC 1.51, FEV1 0.87.  Postbronchodilator spirometry indicates 50% improvement in FVC and 36% improvement in FEV1  Assessment and Plan: 1. Moderate persistent asthma, unspecified whether complicated   2. Non-seasonal allergic rhinitis due to other allergic trigger   3. Perennial allergic conjunctivitis of both eyes   4. Gastroesophageal reflux disease, unspecified whether esophagitis present     Patient Instructions  Asthma Continue montelukast 10 mg once a day to prevent cough or wheeze Continue Symbicort 160-2 puffs twice a day with a spacer to prevent cough or  wheeze Continue Spiriva 2.5 mcg take 1 puff once a day to prevent cough or wheeze Continue albuterol 2 puffs every 4 hours as needed for cough or wheeze OR Instead use albuterol 0.083% solution via nebulizer one unit vial every 4 hours as needed for cough or wheeze You may use albuterol 2 puffs 5 to 15 minutes before activity to decrease cough or wheeze Continue Fasenra injections. Have access to an epinephrine autoinjector set Keep your appointment with Dr. Su Monks  Allergic rhinitis Continue allergen avoidance measures directed toward dust mites as listed below Continue Flonase 2 sprays in each nostril once a day as needed for stuffy nose.  In the right nostril, point the applicator out toward the right ear. In the left nostril, point the applicator out toward the left ear Consider saline nasal rinses as needed for nasal symptoms. Use this before any medicated nasal sprays for best result For thick postnasal drainage, begin Mucinex 600 to 1200 mg twice a day and increase fluid intake in order to thin mucus  Allergic conjunctivitis Some over the counter eye drops include Pataday one drop in each eye once a day as needed for red, itchy eyes OR Zaditor one drop in each eye twice a day as needed for red itchy eyes.  Reflux Continue dietary and lifestyle modifications as listed below Continue famotidine and omeprazole as previously prescribed  Call the clinic if this treatment plan is not working well for you  Follow up in 3 months or sooner if needed.   Return in about 3 months (around 12/13/2020), or if symptoms worsen or fail to improve.    Thank you for the opportunity to care for this patient.  Please do not hesitate to contact me with questions.  Thermon Leyland, FNP [facility]

## 2020-09-14 NOTE — Telephone Encounter (Signed)
Patient is scheduled to see Dr Su Monks on 10/06/2020. Patient did schedule this appointment with their office as they contact the patient to schedule.  Thanks

## 2020-10-04 ENCOUNTER — Ambulatory Visit (INDEPENDENT_AMBULATORY_CARE_PROVIDER_SITE_OTHER): Payer: Medicare Other

## 2020-10-04 DIAGNOSIS — J4550 Severe persistent asthma, uncomplicated: Secondary | ICD-10-CM

## 2020-10-04 DIAGNOSIS — J4540 Moderate persistent asthma, uncomplicated: Secondary | ICD-10-CM

## 2020-10-04 MED ORDER — BENRALIZUMAB 30 MG/ML ~~LOC~~ SOSY
30.0000 mg | PREFILLED_SYRINGE | SUBCUTANEOUS | Status: DC
  Administered 2020-10-04: 30 mg via SUBCUTANEOUS

## 2020-11-01 ENCOUNTER — Other Ambulatory Visit: Payer: Self-pay

## 2020-11-01 ENCOUNTER — Ambulatory Visit (INDEPENDENT_AMBULATORY_CARE_PROVIDER_SITE_OTHER): Payer: Medicare Other

## 2020-11-01 DIAGNOSIS — J4550 Severe persistent asthma, uncomplicated: Secondary | ICD-10-CM

## 2020-11-01 DIAGNOSIS — J4540 Moderate persistent asthma, uncomplicated: Secondary | ICD-10-CM

## 2020-11-01 MED ORDER — BENRALIZUMAB 30 MG/ML ~~LOC~~ SOSY
30.0000 mg | PREFILLED_SYRINGE | SUBCUTANEOUS | Status: AC
  Administered 2020-11-01 – 2024-06-01 (×24): 30 mg via SUBCUTANEOUS

## 2020-12-27 ENCOUNTER — Ambulatory Visit: Payer: Self-pay

## 2021-01-03 ENCOUNTER — Other Ambulatory Visit: Payer: Self-pay

## 2021-01-03 ENCOUNTER — Ambulatory Visit (INDEPENDENT_AMBULATORY_CARE_PROVIDER_SITE_OTHER): Payer: Medicare Other

## 2021-01-03 DIAGNOSIS — J4540 Moderate persistent asthma, uncomplicated: Secondary | ICD-10-CM

## 2021-01-03 DIAGNOSIS — J4550 Severe persistent asthma, uncomplicated: Secondary | ICD-10-CM | POA: Diagnosis not present

## 2021-02-28 ENCOUNTER — Ambulatory Visit (INDEPENDENT_AMBULATORY_CARE_PROVIDER_SITE_OTHER): Payer: Medicare Other

## 2021-02-28 ENCOUNTER — Other Ambulatory Visit: Payer: Self-pay

## 2021-02-28 DIAGNOSIS — J4540 Moderate persistent asthma, uncomplicated: Secondary | ICD-10-CM | POA: Diagnosis not present

## 2021-04-25 ENCOUNTER — Ambulatory Visit: Payer: Medicare Other

## 2021-04-30 ENCOUNTER — Ambulatory Visit (INDEPENDENT_AMBULATORY_CARE_PROVIDER_SITE_OTHER): Payer: Medicare Other | Admitting: *Deleted

## 2021-04-30 DIAGNOSIS — J4540 Moderate persistent asthma, uncomplicated: Secondary | ICD-10-CM

## 2021-06-19 ENCOUNTER — Other Ambulatory Visit: Payer: Self-pay | Admitting: *Deleted

## 2021-06-19 MED ORDER — FASENRA 30 MG/ML ~~LOC~~ SOSY: 30.0000 mg | PREFILLED_SYRINGE | SUBCUTANEOUS | 6 refills | Status: DC

## 2021-06-25 ENCOUNTER — Ambulatory Visit (INDEPENDENT_AMBULATORY_CARE_PROVIDER_SITE_OTHER): Payer: No Typology Code available for payment source

## 2021-06-25 ENCOUNTER — Other Ambulatory Visit: Payer: Self-pay

## 2021-06-25 DIAGNOSIS — J4550 Severe persistent asthma, uncomplicated: Secondary | ICD-10-CM

## 2021-07-24 ENCOUNTER — Ambulatory Visit: Payer: Medicare Other | Admitting: Family Medicine

## 2021-08-14 ENCOUNTER — Emergency Department (HOSPITAL_BASED_OUTPATIENT_CLINIC_OR_DEPARTMENT_OTHER)
Admission: EM | Admit: 2021-08-14 | Discharge: 2021-08-14 | Disposition: A | Discharge status: Home / Self Care | Payer: No Typology Code available for payment source | Attending: Emergency Medicine | Admitting: Emergency Medicine

## 2021-08-14 ENCOUNTER — Encounter (HOSPITAL_BASED_OUTPATIENT_CLINIC_OR_DEPARTMENT_OTHER): Payer: Self-pay | Admitting: *Deleted

## 2021-08-14 ENCOUNTER — Other Ambulatory Visit: Payer: Self-pay

## 2021-08-14 DIAGNOSIS — M542 Cervicalgia: Secondary | ICD-10-CM | POA: Insufficient documentation

## 2021-08-14 DIAGNOSIS — Z7984 Long term (current) use of oral hypoglycemic drugs: Secondary | ICD-10-CM | POA: Diagnosis not present

## 2021-08-14 DIAGNOSIS — Z7982 Long term (current) use of aspirin: Secondary | ICD-10-CM | POA: Insufficient documentation

## 2021-08-14 DIAGNOSIS — M62830 Muscle spasm of back: Secondary | ICD-10-CM | POA: Diagnosis not present

## 2021-08-14 DIAGNOSIS — M62838 Other muscle spasm: Secondary | ICD-10-CM

## 2021-08-14 DIAGNOSIS — M25512 Pain in left shoulder: Secondary | ICD-10-CM | POA: Insufficient documentation

## 2021-08-14 DIAGNOSIS — Y92410 Unspecified street and highway as the place of occurrence of the external cause: Secondary | ICD-10-CM | POA: Insufficient documentation

## 2021-08-14 MED ORDER — METHYLPREDNISOLONE 4 MG PO TBPK: ORAL_TABLET | ORAL | 0 refills | Status: DC

## 2021-08-14 NOTE — Discharge Instructions (Signed)
Recommend 1000 mg of Tylenol every 6 hours as needed for pain.  Recommend buying over-the-counter lidocaine patches.  I have prescribed you steroids to help with your discomfort as well.  Follow-up with sports medicine.
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
## 2021-08-14 NOTE — ED Notes (Signed)
ED Provider at bedside. 
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
## 2021-08-20 ENCOUNTER — Other Ambulatory Visit: Payer: Self-pay

## 2021-08-20 ENCOUNTER — Ambulatory Visit (INDEPENDENT_AMBULATORY_CARE_PROVIDER_SITE_OTHER): Payer: No Typology Code available for payment source

## 2021-08-20 DIAGNOSIS — J4550 Severe persistent asthma, uncomplicated: Secondary | ICD-10-CM

## 2021-08-22 ENCOUNTER — Ambulatory Visit (INDEPENDENT_AMBULATORY_CARE_PROVIDER_SITE_OTHER): Payer: No Typology Code available for payment source | Admitting: Family Medicine

## 2021-08-22 ENCOUNTER — Encounter: Payer: Self-pay | Admitting: Family Medicine

## 2021-08-22 VITALS — BP 120/82 | Ht 66.0 in | Wt 215.0 lb

## 2021-08-22 DIAGNOSIS — M7918 Myalgia, other site: Secondary | ICD-10-CM

## 2021-08-22 DIAGNOSIS — S161XXA Strain of muscle, fascia and tendon at neck level, initial encounter: Secondary | ICD-10-CM | POA: Diagnosis not present
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
## 2021-08-22 NOTE — Patient Instructions (Signed)
Nice to meet you Please try heat  Please try the exercises  Please try physical therapy  Please send me a message in MyChart with any questions or updates.  Please see me back in 4 weeks.   --Dr. Aceyn Kathol  
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
## 2021-10-15 ENCOUNTER — Ambulatory Visit (INDEPENDENT_AMBULATORY_CARE_PROVIDER_SITE_OTHER): Payer: No Typology Code available for payment source | Admitting: *Deleted

## 2021-10-15 DIAGNOSIS — J4550 Severe persistent asthma, uncomplicated: Secondary | ICD-10-CM | POA: Diagnosis not present

## 2021-12-10 ENCOUNTER — Ambulatory Visit: Payer: No Typology Code available for payment source

## 2021-12-13 ENCOUNTER — Ambulatory Visit (INDEPENDENT_AMBULATORY_CARE_PROVIDER_SITE_OTHER): Payer: No Typology Code available for payment source

## 2021-12-13 DIAGNOSIS — J4550 Severe persistent asthma, uncomplicated: Secondary | ICD-10-CM | POA: Diagnosis not present

## 2022-02-07 ENCOUNTER — Ambulatory Visit (INDEPENDENT_AMBULATORY_CARE_PROVIDER_SITE_OTHER): Payer: No Typology Code available for payment source

## 2022-02-07 DIAGNOSIS — J4550 Severe persistent asthma, uncomplicated: Secondary | ICD-10-CM

## 2022-04-04 ENCOUNTER — Ambulatory Visit (INDEPENDENT_AMBULATORY_CARE_PROVIDER_SITE_OTHER): Payer: No Typology Code available for payment source

## 2022-04-04 DIAGNOSIS — J4550 Severe persistent asthma, uncomplicated: Secondary | ICD-10-CM | POA: Diagnosis not present

## 2022-05-28 ENCOUNTER — Ambulatory Visit: Payer: No Typology Code available for payment source

## 2022-05-30 ENCOUNTER — Ambulatory Visit (INDEPENDENT_AMBULATORY_CARE_PROVIDER_SITE_OTHER): Payer: No Typology Code available for payment source

## 2022-05-30 ENCOUNTER — Ambulatory Visit: Payer: No Typology Code available for payment source

## 2022-05-30 DIAGNOSIS — J4550 Severe persistent asthma, uncomplicated: Secondary | ICD-10-CM

## 2022-06-01 IMAGING — DX DG CHEST 2V
2 series · 2 of 2 positions shown · non-contrast
Comparison: 12/27/2014.

CLINICAL DATA: Cough.

EXAM:
CHEST - 2 VIEW

[chest pa]
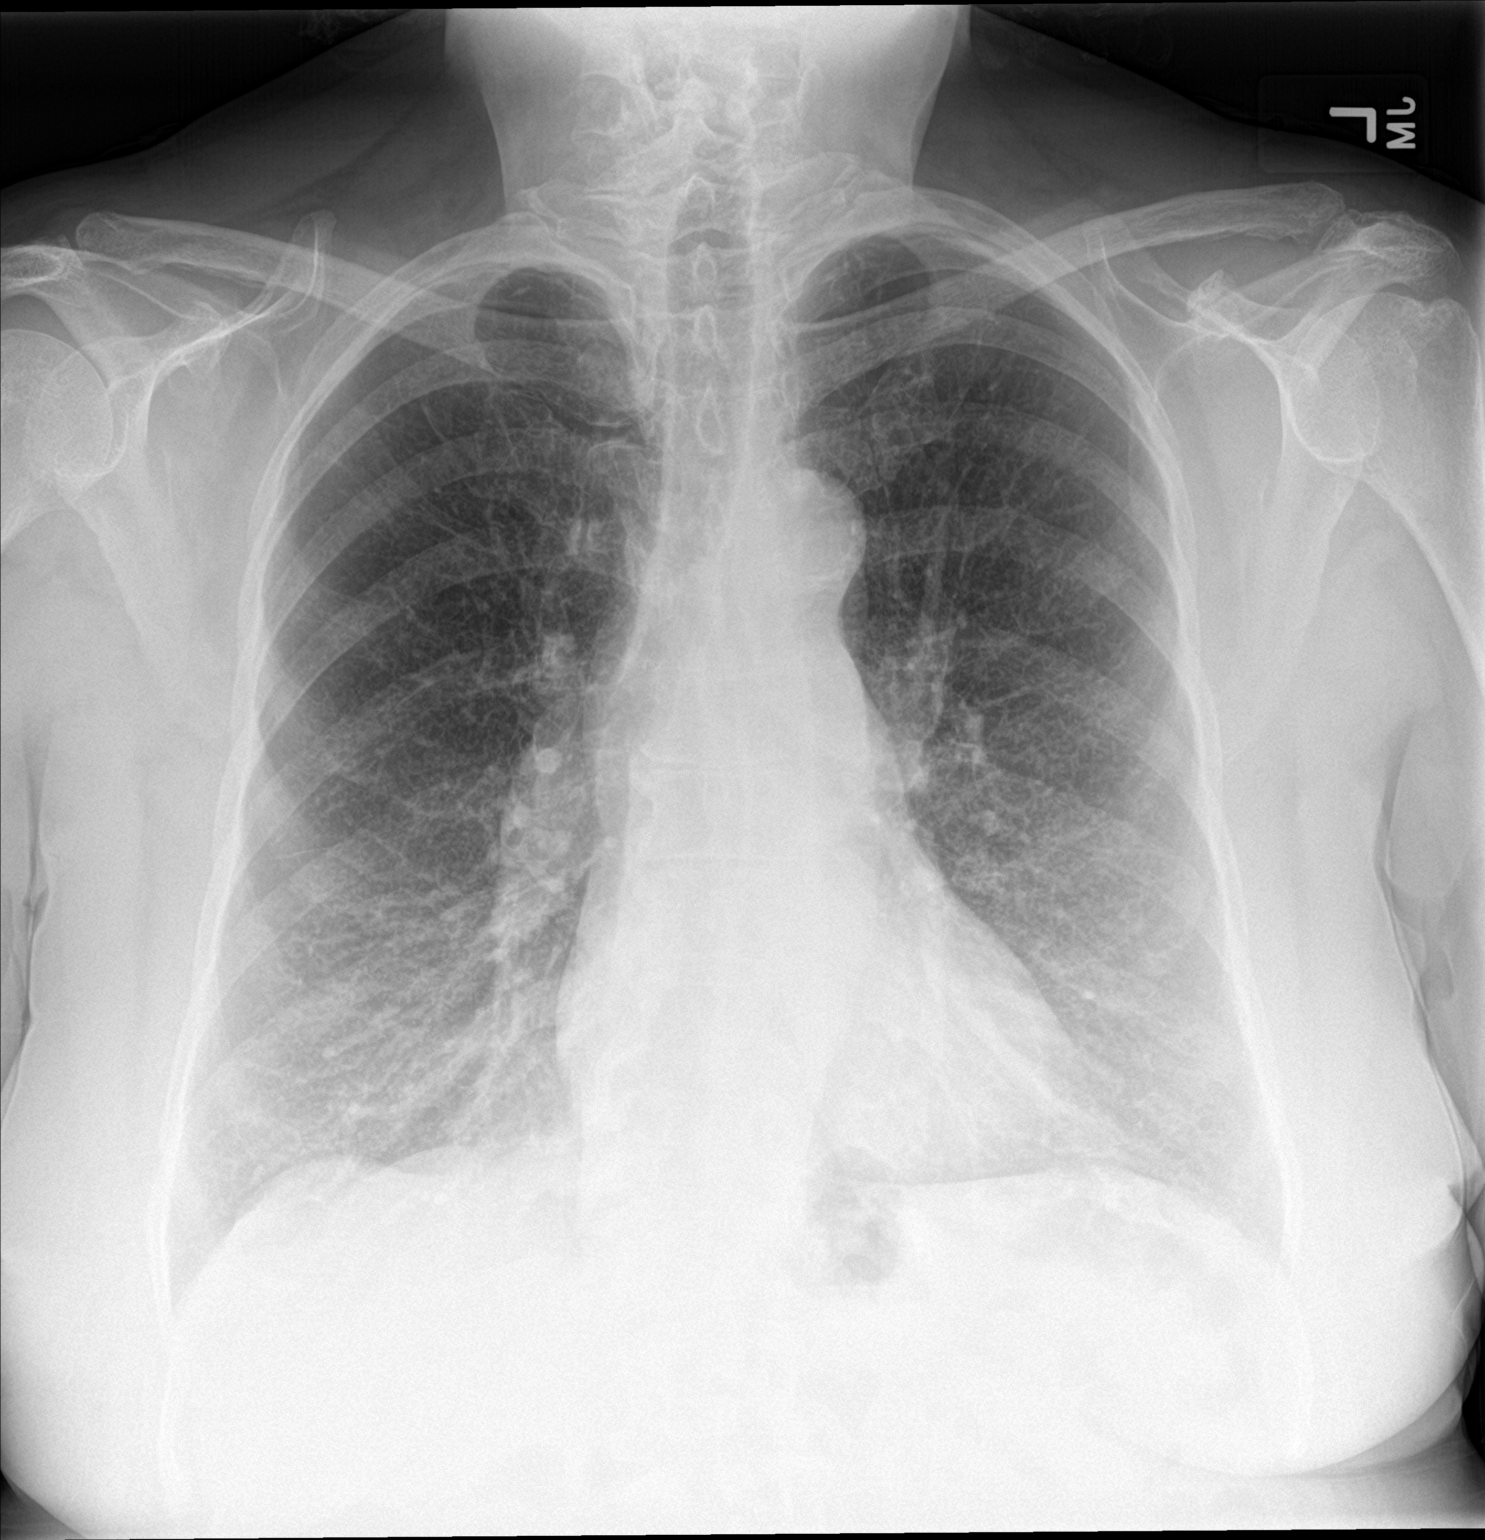

[chest lat]
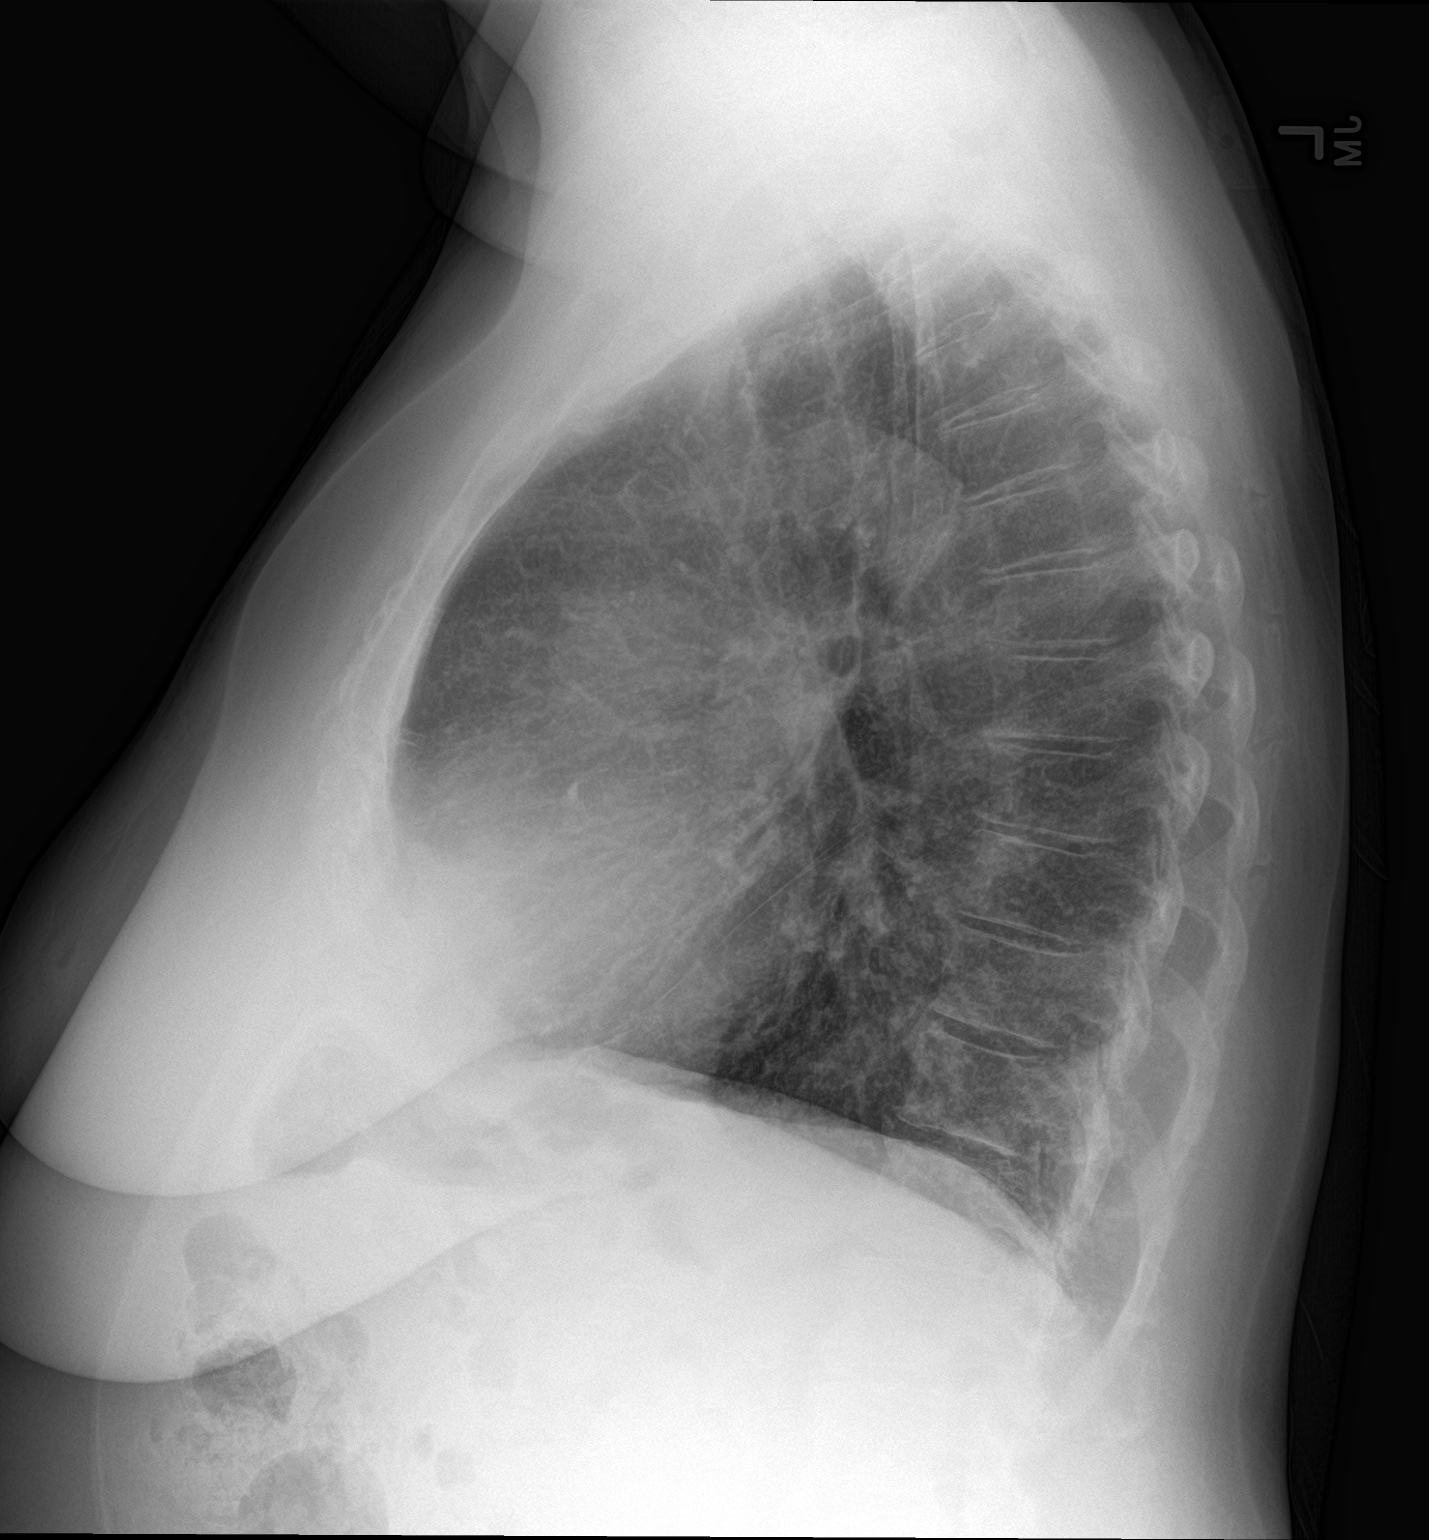

[2 of 2 positions shown; findings below may reference images not displayed]

FINDINGS: Mediastinum and hilar structures normal. Heart size normal. Mild
bilateral interstitial prominence. Pneumonitis cannot be excluded.
No pleural effusion or pneumothorax. No acute bony abnormality
identified.
IMPRESSION: Mild bilateral interstitial prominence. Pneumonitis cannot be
excluded.

## 2022-07-09 ENCOUNTER — Other Ambulatory Visit: Payer: Self-pay | Admitting: *Deleted

## 2022-07-09 MED ORDER — FASENRA 30 MG/ML ~~LOC~~ SOSY: 30.0000 mg | PREFILLED_SYRINGE | SUBCUTANEOUS | 6 refills | Status: DC

## 2022-07-25 ENCOUNTER — Ambulatory Visit: Payer: No Typology Code available for payment source

## 2022-08-06 ENCOUNTER — Ambulatory Visit: Payer: No Typology Code available for payment source

## 2022-08-13 ENCOUNTER — Ambulatory Visit (INDEPENDENT_AMBULATORY_CARE_PROVIDER_SITE_OTHER): Payer: No Typology Code available for payment source

## 2022-08-13 DIAGNOSIS — J4550 Severe persistent asthma, uncomplicated: Secondary | ICD-10-CM

## 2022-09-30 ENCOUNTER — Encounter: Payer: Self-pay | Admitting: *Deleted

## 2022-10-08 ENCOUNTER — Ambulatory Visit (INDEPENDENT_AMBULATORY_CARE_PROVIDER_SITE_OTHER): Payer: No Typology Code available for payment source

## 2022-10-08 DIAGNOSIS — J4550 Severe persistent asthma, uncomplicated: Secondary | ICD-10-CM | POA: Diagnosis not present

## 2022-12-03 ENCOUNTER — Ambulatory Visit: Payer: No Typology Code available for payment source

## 2022-12-03 DIAGNOSIS — J4550 Severe persistent asthma, uncomplicated: Secondary | ICD-10-CM | POA: Diagnosis not present

## 2023-01-28 ENCOUNTER — Ambulatory Visit: Payer: No Typology Code available for payment source

## 2023-01-28 DIAGNOSIS — J4550 Severe persistent asthma, uncomplicated: Secondary | ICD-10-CM

## 2023-03-25 ENCOUNTER — Ambulatory Visit: Payer: No Typology Code available for payment source

## 2023-03-25 DIAGNOSIS — J4550 Severe persistent asthma, uncomplicated: Secondary | ICD-10-CM | POA: Diagnosis not present

## 2023-05-20 ENCOUNTER — Ambulatory Visit: Payer: No Typology Code available for payment source | Admitting: Internal Medicine

## 2023-05-20 ENCOUNTER — Other Ambulatory Visit: Payer: Self-pay

## 2023-05-20 ENCOUNTER — Encounter: Payer: Self-pay | Admitting: Internal Medicine

## 2023-05-20 ENCOUNTER — Telehealth: Payer: Self-pay | Admitting: Internal Medicine

## 2023-05-20 ENCOUNTER — Ambulatory Visit: Payer: No Typology Code available for payment source

## 2023-05-20 VITALS — BP 118/68 | HR 60 | Temp 97.8°F

## 2023-05-20 DIAGNOSIS — J431 Panlobular emphysema: Secondary | ICD-10-CM

## 2023-05-20 DIAGNOSIS — J4550 Severe persistent asthma, uncomplicated: Secondary | ICD-10-CM

## 2023-05-20 DIAGNOSIS — K219 Gastro-esophageal reflux disease without esophagitis: Secondary | ICD-10-CM

## 2023-05-20 DIAGNOSIS — G4733 Obstructive sleep apnea (adult) (pediatric): Secondary | ICD-10-CM | POA: Diagnosis not present

## 2023-05-20 DIAGNOSIS — J3089 Other allergic rhinitis: Secondary | ICD-10-CM

## 2023-05-20 MED ORDER — OMEPRAZOLE 20 MG PO CPDR: 20.0000 mg | DELAYED_RELEASE_CAPSULE | Freq: Every day | ORAL | 5 refills | Status: AC

## 2023-05-20 MED ORDER — ALBUTEROL SULFATE HFA 108 (90 BASE) MCG/ACT IN AERS: 2.0000 | INHALATION_SPRAY | RESPIRATORY_TRACT | 2 refills | Status: AC | PRN

## 2023-05-20 MED ORDER — BUDESONIDE-FORMOTEROL FUMARATE 160-4.5 MCG/ACT IN AERO: 2.0000 | INHALATION_SPRAY | Freq: Two times a day (BID) | RESPIRATORY_TRACT | 5 refills | Status: DC

## 2023-05-20 MED ORDER — ALBUTEROL SULFATE (2.5 MG/3ML) 0.083% IN NEBU: 2.5000 mg | INHALATION_SOLUTION | RESPIRATORY_TRACT | 1 refills | Status: DC | PRN

## 2023-05-20 MED ORDER — MONTELUKAST SODIUM 10 MG PO TABS: 10.0000 mg | ORAL_TABLET | Freq: Every day | ORAL | 5 refills | Status: AC

## 2023-05-20 MED ORDER — EPINEPHRINE 0.3 MG/0.3ML IJ SOAJ: 0.3000 mg | INTRAMUSCULAR | 1 refills | Status: AC | PRN

## 2023-05-20 MED ORDER — ALBUTEROL SULFATE HFA 108 (90 BASE) MCG/ACT IN AERS: 2.0000 | INHALATION_SPRAY | RESPIRATORY_TRACT | 2 refills | Status: DC | PRN

## 2023-05-20 MED ORDER — ALBUTEROL SULFATE (2.5 MG/3ML) 0.083% IN NEBU: 2.5000 mg | INHALATION_SOLUTION | RESPIRATORY_TRACT | 1 refills | Status: AC | PRN

## 2023-05-20 MED ORDER — AZELASTINE HCL 0.1 % NA SOLN: 2.0000 | Freq: Two times a day (BID) | NASAL | 12 refills | Status: AC

## 2023-05-20 NOTE — Progress Notes (Signed)
FOLLOW UP Date of Service/Encounter:  05/20/23  Subjective:  Tina [name] (DOB: Nov 13, 1945) is a 77 y.o. female who [facility] in re-evaluation of the following: severe persistent asthma, allergic rhinitis, GERD, reflux.  History obtained from: chart review and patient.  For Review, LV was on 09/12/20  with Thermon Leyland, FNP seen for routine follow-up. See below for summary of history and diagnostics.   Therapeutic plans/changes recommended: FEV1 0.65, with 36% improvement in FEV1.   She is continued on Symbicort, Spiriva, Fasenra (started 09/06/2020).  Rhinitis is well-controlled with Flonase and saline nasal rinses.  She was also referred back to pulmonary due to abnormal chest x-ray with bilateral interstitial prominence.  FEV1 0.87 L ----------------------------------------------------- --------------------------------------------------- Today presents for follow-up. Discussed the use of AI scribe software for clinical note transcription with the patient, who gave verbal consent to proceed.  History of Present Illness   The patient, with a history of severe asthma, has been on Fasenra injections for over a year. She was hospitalized  in July 24 due to pneumonia. Post-hospitalization, the patient reports occasional flare-ups of shortness of breath, particularly with weather changes or physical exertion, which she manages with a nebulizer treatment. This occurs approximately twice a week.  The patient also has a history of abnormal chest imaging, which was thought to be more than just asthma. She was referred back to pulmonary and was switched to Trelegy by Dr. Su Monks. She also underwent a sleep study and was diagnosed with sleep apnea, for which she uses a CPAP machine nightly.  In addition to asthma and sleep apnea, the patient has been experiencing reflux or heartburn, which is managed with a small pill, likely a proton pump inhibitor. She also  reports dry eyes and is scheduled for cataract surgery in January. She has been using Flonase for nasal symptoms, but due to potential eye side effects, a switch to Astelin is planned.  The patient's lung function has remained stable, which is considered a positive outcome given her multiple lung issues.       Chart Review:  Pulm Note: 12/23: Telehealth, transitioned to trelagy. - "Called for telehealth visit today to go over her recent sleep study otherwise on imaging and PFTs at last visit were discussed which were reassuring, x-ray mainly showed bilateral interstitial prominence suggesting edema, reviewed PFTs, ratio 55 FEV1 of 50% FVC of 71%, chest CT with diffuse peribronchovascular nodularity both lungs nonspecific 7 mm noncalcified left lower lobe pulmonary nodule repeat chest CT in 12 months would be recommended "  CTPA: " 1. No evidence of acute pulmonary embolism or other acute vascular  findings in the chest.  2. Central enlargement of the pulmonary arteries suspicious for  pulmonary arterial hypertension.  3. Multifocal patchy airspace opacities in both lungs, most  consistent with multilobar pneumonia. Radiographic follow-up  recommended to ensure resolution.  4. Mildly enlarged mediastinal and right hilar lymph nodes, likely  reactive.  5. Aortic Atherosclerosis (ICD10-I70.0). "  All medications reviewed by clinical staff and updated in chart. No new pertinent medical or surgical history except as noted in HPI.  ROS: All others negative except as noted per HPI.   Objective:  BP 118/68   Pulse 60   Temp 97.8 F (36.6 C) (Temporal)   SpO2 97%  There is no height or weight on file to calculate BMI. Physical Exam: General Appearance:  Alert, cooperative, no distress, appears stated age  Head:  Normocephalic, without obvious  abnormality, atraumatic  Eyes:  Conjunctiva clear, EOM's intact  Ears EACs normal bilaterally  Nose: Nares normal,  erythematous nasal mucosa , no  visible anterior polyps, and septum midline  Throat: Lips, tongue normal; teeth and gums normal, normal posterior oropharynx  Neck: Supple, symmetrical  Lungs:   clear to auscultation bilaterally, Respirations unlabored, no coughing  Heart:  regular rate and rhythm and no murmur, Appears well perfused  Extremities: No edema  Skin: Skin color, texture, turgor normal and no rashes or lesions on visualized portions of skin  Neurologic: No gross deficits   Labs:  Lab Orders  No laboratory test(s) ordered today    Spirometry:  Tracings reviewed. Her effort: Good reproducible efforts. FVC: 1.54 L FEV1: 0.88 L, 47% predicted FEV1/FVC ratio: 57% Interpretation: Spirometry consistent with mixed obstructive and restrictive disease.  Please see scanned spirometry results for details.    Assessment/Plan   Asthma: Persistent asthma  Continue montelukast 10 mg once a day to prevent cough or wheeze Continue Trelegy 1 puff daily  Continue albuterol 2 puffs every 4 hours as needed for cough or wheeze OR Instead use albuterol 0.083% solution via nebulizer one unit vial every 4 hours as needed for cough or wheeze You may use albuterol 2 puffs 5 to 15 minutes before activity to decrease cough or wheeze Continue Fasenra injections. Have access to an epinephrine autoinjector set Keep your appointment with Dr. Su Monks   Allergic rhinitis Continue allergen avoidance measures directed toward dust mites as listed below STOP  Flonase 2 sprays in each nostril once a day as needed for stuffy nose.   START Astelin (azelastine) use 1-2 sprays in each nostril up to three times daily as needed for NASAL CONGESTION/ITCHY NOSE. Consider saline nasal rinses as needed for nasal symptoms. Use this before any medicated nasal sprays for best result   Allergic conjunctivitis Some over the counter eye drops include Pataday one drop in each eye once a day as needed for red, itchy eyes OR Zaditor one drop in each  eye twice a day as needed for red itchy eyes.   Reflux Continue dietary and lifestyle modifications as listed below Continue omeprazole as previously prescribed   Call the clinic if this treatment plan is not working well for you   Follow up in 6 months or sooner if needed.  Other:  none   Thank you so much for letting me partake in your care today.  Don't hesitate to reach out if you have any additional concerns!  Ferol Luz, MD  [facility] [place], [place]

## 2023-05-20 NOTE — Telephone Encounter (Signed)
Error

## 2023-05-20 NOTE — Patient Instructions (Signed)
Asthma: Persistent asthma  Continue montelukast 10 mg once a day to prevent cough or wheeze Continue Trelegy 1 puff daily  Continue albuterol 2 puffs every 4 hours as needed for cough or wheeze OR Instead use albuterol 0.083% solution via nebulizer one unit vial every 4 hours as needed for cough or wheeze You may use albuterol 2 puffs 5 to 15 minutes before activity to decrease cough or wheeze Continue Fasenra injections. Have access to an epinephrine autoinjector set Keep your appointment with Dr. Su Monks   Allergic rhinitis Continue allergen avoidance measures directed toward dust mites as listed below STOP  Flonase 2 sprays in each nostril once a day as needed for stuffy nose.   START Astelin (azelastine) use 1-2 sprays in each nostril up to three times daily as needed for NASAL CONGESTION/ITCHY NOSE. Consider saline nasal rinses as needed for nasal symptoms. Use this before any medicated nasal sprays for best result   Allergic conjunctivitis Some over the counter eye drops include Pataday one drop in each eye once a day as needed for red, itchy eyes OR Zaditor one drop in each eye twice a day as needed for red itchy eyes.   Reflux Continue dietary and lifestyle modifications as listed below Continue omeprazole as previously prescribed   Call the clinic if this treatment plan is not working well for you   Follow up in 6 months or sooner if needed.  Thank you so much for letting me partake in your care today.  Don't hesitate to reach out if you have any additional concerns!  Ferol Luz, MD  [facility] [place], [place]

## 2023-05-20 NOTE — Telephone Encounter (Signed)
Patient called and stated she can't afford the medications she was prescribed today and requested a call back.

## 2023-05-20 NOTE — Telephone Encounter (Signed)
Spoke with pharmacist montelukast omeprazole ventolin and albuterol neb are all $0 copay epipen is $5 the asetlin is $!35 and pt at this time can not afford that is there another option for her

## 2023-05-21 NOTE — Telephone Encounter (Signed)
Pt informed and stated understanding

## 2023-06-19 ENCOUNTER — Other Ambulatory Visit: Payer: Self-pay | Admitting: *Deleted

## 2023-06-19 MED ORDER — FASENRA 30 MG/ML ~~LOC~~ SOSY: 30.0000 mg | PREFILLED_SYRINGE | SUBCUTANEOUS | 6 refills | Status: DC

## 2023-07-15 ENCOUNTER — Ambulatory Visit: Payer: No Typology Code available for payment source

## 2023-07-17 ENCOUNTER — Ambulatory Visit (INDEPENDENT_AMBULATORY_CARE_PROVIDER_SITE_OTHER): Payer: No Typology Code available for payment source

## 2023-07-17 DIAGNOSIS — J4550 Severe persistent asthma, uncomplicated: Secondary | ICD-10-CM | POA: Diagnosis not present

## 2023-09-11 ENCOUNTER — Ambulatory Visit: Payer: No Typology Code available for payment source

## 2023-09-16 ENCOUNTER — Ambulatory Visit

## 2023-09-16 DIAGNOSIS — J4550 Severe persistent asthma, uncomplicated: Secondary | ICD-10-CM | POA: Diagnosis not present

## 2023-09-29 ENCOUNTER — Emergency Department (HOSPITAL_BASED_OUTPATIENT_CLINIC_OR_DEPARTMENT_OTHER)

## 2023-09-29 ENCOUNTER — Emergency Department (HOSPITAL_BASED_OUTPATIENT_CLINIC_OR_DEPARTMENT_OTHER)
Admission: EM | Admit: 2023-09-29 | Discharge: 2023-09-29 | Disposition: A | Discharge status: Home / Self Care | Attending: Emergency Medicine | Admitting: Emergency Medicine

## 2023-09-29 ENCOUNTER — Other Ambulatory Visit: Payer: Self-pay

## 2023-09-29 ENCOUNTER — Encounter (HOSPITAL_BASED_OUTPATIENT_CLINIC_OR_DEPARTMENT_OTHER): Payer: Self-pay

## 2023-09-29 DIAGNOSIS — J45909 Unspecified asthma, uncomplicated: Secondary | ICD-10-CM | POA: Insufficient documentation

## 2023-09-29 DIAGNOSIS — W010XXA Fall on same level from slipping, tripping and stumbling without subsequent striking against object, initial encounter: Secondary | ICD-10-CM | POA: Diagnosis not present

## 2023-09-29 DIAGNOSIS — Z7982 Long term (current) use of aspirin: Secondary | ICD-10-CM | POA: Insufficient documentation

## 2023-09-29 DIAGNOSIS — Z79899 Other long term (current) drug therapy: Secondary | ICD-10-CM | POA: Insufficient documentation

## 2023-09-29 DIAGNOSIS — Z7901 Long term (current) use of anticoagulants: Secondary | ICD-10-CM | POA: Insufficient documentation

## 2023-09-29 DIAGNOSIS — E119 Type 2 diabetes mellitus without complications: Secondary | ICD-10-CM | POA: Insufficient documentation

## 2023-09-29 DIAGNOSIS — M545 Low back pain, unspecified: Secondary | ICD-10-CM | POA: Diagnosis present

## 2023-09-29 DIAGNOSIS — Y92010 Kitchen of single-family (private) house as the place of occurrence of the external cause: Secondary | ICD-10-CM | POA: Insufficient documentation

## 2023-09-29 DIAGNOSIS — Z7984 Long term (current) use of oral hypoglycemic drugs: Secondary | ICD-10-CM | POA: Diagnosis not present

## 2023-09-29 DIAGNOSIS — S0990XA Unspecified injury of head, initial encounter: Secondary | ICD-10-CM | POA: Insufficient documentation

## 2023-09-29 DIAGNOSIS — S322XXA Fracture of coccyx, initial encounter for closed fracture: Secondary | ICD-10-CM | POA: Insufficient documentation

## 2023-09-29 DIAGNOSIS — Y92009 Unspecified place in unspecified non-institutional (private) residence as the place of occurrence of the external cause: Secondary | ICD-10-CM

## 2023-09-29 MED ORDER — ACETAMINOPHEN 500 MG PO TABS
1000.0000 mg | ORAL_TABLET | Freq: Once | ORAL | Status: AC
  Administered 2023-09-29: 1000 mg via ORAL
  Filled 2023-09-29: qty 2

## 2023-09-29 NOTE — Discharge Instructions (Signed)
 Thank you for coming to [facility]. You were seen for fall at home. We did an exam, labs, and imaging, and these showed a possible nondisplaced fracture of your tailbone. Using a rubber or inflated ring or cushion when sitting can help with discomfort. Please follow up with your primary care provider within 1 week. You can take tylenol 1,000 mg every 8 hours for pain. If your pain doesn't improve in the next 1-2 weeks, you may consider following up with an orthopedic surgeon.  Do not hesitate to return to the ED or call 911 if you experience: -Worsening symptoms -Numbness/tingling -Lightheadedness, passing out -Fevers/chills -Anything else that concerns you

## 2023-09-29 NOTE — ED Triage Notes (Signed)
 States was in the kitchen when she lost her balance and fell backwards, hitting head on fridge. Also c/o right buttocks pain. Takes plavix.

## 2023-09-29 NOTE — ED Provider Notes (Signed)
 [place] [facility] Provider Note   CSN: [id number] Arrival date & time: 09/29/23  1810     History {Add pertinent medical, surgical, social history, OB history to HPI:1} No chief complaint on file.   Tina [name] is a 78 y.o. female with PMH as listed below who presents with fall, head trauma.  States was in the kitchen when she lost her balance and fell backwards, hitting head on fridge. Also c/o right buttocks pain. Takes plavix.   Past Medical History:  Diagnosis Date   Arthritis    Asthma    Diabetes mellitus    Hypertension        Home Medications Prior to Admission medications   Medication Sig Start Date End Date Taking? Authorizing Provider  Accu-Chek Softclix Lancets lancets TEST TWICE A DAY 12/16/16   [provider]  albuterol (PROVENTIL) (2.5 MG/3ML) 0.083% nebulizer solution Inhale 3 mLs (2.5 mg total) into the lungs every 4 (four) hours as needed for wheezing or shortness of breath. 05/20/23 05/04/26  Orelia Binet, MD  albuterol (VENTOLIN HFA) 108 (90 Base) MCG/ACT inhaler Inhale 2 puffs into the lungs every 4 (four) hours as needed for wheezing or shortness of breath. 05/20/23   Orelia Binet, MD  allopurinol (ZYLOPRIM) 100 MG tablet Take 100 mg by mouth daily. 08/03/20   [provider]  aspirin 81 MG tablet Take 325 mg by mouth daily.     [provider]  atorvastatin (LIPITOR) 80 MG tablet Take by mouth. 08/12/19   [provider]  azelastine (ASTELIN) 0.1 % nasal spray Place 2 sprays into both nostrils 2 (two) times daily. Use in each nostril as directed 05/20/23   Orelia Binet, MD  benralizumab [facility]) 30 MG/ML prefilled syringe Inject 1 mL (30 mg total) into the skin every 8 (eight) weeks. 06/19/23   Ardie Kras, FNP  BIOTIN PO Take by mouth.    [provider]  Calcium-Vitamin D-Vitamin K 500-100-40 MG-UNT-MCG CHEW Chew 2 tablets by mouth daily.    [provider]  Cholecalciferol 50 MCG (2000 UT) TABS Take by mouth.    [provider]  clopidogrel (PLAVIX) 75 MG tablet Take 75 mg by mouth daily.    [provider]  EPINEPHrine 0.3 mg/0.3 mL IJ SOAJ injection Inject 0.3 mg into the muscle as needed for anaphylaxis. 05/20/23   Orelia Binet, MD  ezetimibe (ZETIA) 10 MG tablet Take 10 mg by mouth daily. 11/28/22   [provider]  ferrous sulfate 325 (65 FE) MG tablet Take 1 tablet by mouth daily with breakfast. 05/17/20   [provider]  Fluticasone-Umeclidin-Vilant (TRELEGY ELLIPTA) 100-62.5-25 MCG/ACT AEPB  06/18/22   [provider]  gabapentin (NEURONTIN) 300 MG capsule Take 300 mg by mouth 3 (three) times daily.    [provider]  glipiZIDE (GLUCOTROL XL) 5 MG 24 hr tablet  03/31/19   [provider]  glucose blood (ACCU-CHEK AVIVA PLUS) test strip USE TO TEST BLOOD SUGAR TWICE DAILY 12/05/16   [provider]  IRON CR PO Take by mouth.    [provider]  montelukast (SINGULAIR) 10 MG tablet Take 1 tablet (10 mg total) by mouth at bedtime. 05/20/23   Orelia Binet, MD  Olopatadine HCl (PATADAY) 0.2 % SOLN Place 1 drop into both eyes daily as needed. 01/03/20   Bobbitt, Colen Daunt, MD  Omega-3 Fatty Acids (FISH OIL PO) Take by mouth.    [provider]  omeprazole (PRILOSEC) 20 MG capsule Take 1 capsule (20 mg total) by mouth daily. 05/20/23   Orelia Binet, MD  potassium chloride (K-DUR,KLOR-CON) 10 MEQ tablet Take 10 mEq by mouth daily.    [provider]  potassium chloride (MICRO-K) 10 MEQ CR capsule TAKE 1 CAPSULE EVERY MORNING WITH BREAKFAST FOR POTASSIUM 06/01/20   [provider]  pyridoxine (B-6) 100 MG tablet Take by mouth. B12    [provider]  sertraline (ZOLOFT) 25 MG tablet Take 25 mg by mouth daily.    [provider]  valsartan-hydrochlorothiazide (DIOVAN-HCT) 160-25 MG tablet Take 1 tablet by  mouth daily. 04/01/22   [provider]  vitamin B-12 (CYANOCOBALAMIN) 500 MCG tablet Take 500 mcg by mouth daily.    [provider]  vitamin C (ASCORBIC ACID) 500 MG tablet Take 500 mg by mouth daily.    [provider]      Allergies    Metformin, Aleve [naproxen], Lactose intolerance (gi), Other, Percocet [oxycodone-acetaminophen], and Vicodin [hydrocodone-acetaminophen]    Review of Systems   Review of Systems A 10 point review of systems was performed and is negative unless otherwise reported in HPI.  Physical Exam Updated Vital Signs BP (!) 142/83 (BP Location: Right Arm)   Pulse 81   Temp 98 F (36.7 C) (Oral)   Resp 18   Ht 5\' 6"  (1.676 m)   Wt 99.8 kg   SpO2 96%   BMI 35.51 kg/m  Physical Exam General: Normal appearing {Desc; female/female:11659}, lying in bed.  HEENT: PERRLA, Sclera anicteric, MMM, trachea midline.  Cardiology: RRR, no murmurs/rubs/gallops. BL radial and DP pulses equal bilaterally.  Resp: Normal respiratory rate and effort. CTAB, no wheezes, rhonchi, crackles.  Abd: Soft, non-tender, non-distended. No rebound tenderness or guarding.  GU: Deferred. MSK: No peripheral edema or signs of trauma. Extremities without deformity or TTP. No cyanosis or clubbing. Skin: warm, dry. No rashes or lesions. Back: No CVA tenderness Neuro: A&Ox4, CNs II-XII grossly intact. MAEs. Sensation grossly intact.  Psych: Normal mood and affect.   ED Results / Procedures / Treatments   Labs (all labs ordered are listed, but only abnormal results are displayed) Labs Reviewed - No data to display  EKG None  Radiology No results found.  Procedures Procedures  {Document cardiac monitor, telemetry assessment procedure when appropriate:1}  Medications Ordered in ED Medications - No data to display  ED Course/ Medical Decision Making/ A&P                          Medical Decision Making Amount and/or Complexity of Data Reviewed Radiology:  ordered.    This patient presents to the ED for concern of ***, this involves an extensive number of treatment options, and is a complaint that carries with it a high risk of complications and morbidity.  I considered the following differential and admission for this acute, potentially life threatening condition.   MDM:    ***     Labs: I Ordered, and personally interpreted labs.  The pertinent results include:  ***  Imaging Studies ordered: I ordered imaging studies including *** I independently visualized and interpreted imaging. I agree with the radiologist interpretation  Additional history obtained from ***.  External records from outside source obtained and reviewed including ***  Cardiac Monitoring: The patient was maintained on a cardiac monitor.  I personally viewed and interpreted the cardiac monitored which showed an underlying rhythm of: ***  Reevaluation:  After the interventions noted above, I reevaluated the patient and found that they have :{resolved/improved/worsened:23923::"improved"}  Social Determinants of Health: ***  Disposition:  ***  Co morbidities that complicate the patient evaluation  Past Medical History:  Diagnosis Date   Arthritis    Asthma    Diabetes mellitus    Hypertension      Medicines No orders of the defined types were placed in this encounter.   I have reviewed the patients home medicines and have made adjustments as needed  Problem List / ED Course: Problem List Items Addressed This Visit   None        {Document critical care time when appropriate:1} {Document review of labs and clinical decision tools ie heart score, Chads2Vasc2 etc:1}  {Document your independent review of radiology images, and any outside records:1} {Document your discussion with family members, caretakers, and with consultants:1} {Document social determinants of health affecting pt's care:1} {Document your decision making why or why not admission,  treatments were needed:1}  This note was created using dictation software, which may contain spelling or grammatical errors.

## 2023-11-11 ENCOUNTER — Ambulatory Visit

## 2023-12-15 ENCOUNTER — Ambulatory Visit (INDEPENDENT_AMBULATORY_CARE_PROVIDER_SITE_OTHER)

## 2023-12-15 DIAGNOSIS — J4550 Severe persistent asthma, uncomplicated: Secondary | ICD-10-CM

## 2024-02-09 ENCOUNTER — Ambulatory Visit (INDEPENDENT_AMBULATORY_CARE_PROVIDER_SITE_OTHER)

## 2024-02-09 DIAGNOSIS — J4550 Severe persistent asthma, uncomplicated: Secondary | ICD-10-CM | POA: Diagnosis not present

## 2024-04-05 ENCOUNTER — Ambulatory Visit

## 2024-04-06 ENCOUNTER — Ambulatory Visit (INDEPENDENT_AMBULATORY_CARE_PROVIDER_SITE_OTHER)

## 2024-04-06 DIAGNOSIS — J4550 Severe persistent asthma, uncomplicated: Secondary | ICD-10-CM | POA: Diagnosis not present

## 2024-04-12 ENCOUNTER — Other Ambulatory Visit: Payer: Self-pay | Admitting: Internal Medicine

## 2024-05-19 ENCOUNTER — Other Ambulatory Visit: Payer: Self-pay | Admitting: *Deleted

## 2024-05-19 MED ORDER — FASENRA 30 MG/ML ~~LOC~~ SOSY: 30.0000 mg | PREFILLED_SYRINGE | SUBCUTANEOUS | 6 refills | Status: AC

## 2024-06-01 ENCOUNTER — Ambulatory Visit

## 2024-06-01 DIAGNOSIS — J4550 Severe persistent asthma, uncomplicated: Secondary | ICD-10-CM

## 2024-07-27 ENCOUNTER — Ambulatory Visit
